# Patient Record
Sex: Male | Born: 1965
Health system: Southern US, Community
[De-identification: ages and names within clinical notes are randomized; demographics above are authoritative.]

## PROBLEM LIST (undated history)

## (undated) DIAGNOSIS — I10 Essential (primary) hypertension: Secondary | ICD-10-CM

## (undated) DIAGNOSIS — M542 Cervicalgia: Secondary | ICD-10-CM

## (undated) DIAGNOSIS — M549 Dorsalgia, unspecified: Secondary | ICD-10-CM

## (undated) HISTORY — DX: Cervicalgia: M54.2

## (undated) HISTORY — DX: Dorsalgia, unspecified: M54.9

## (undated) HISTORY — PX: NO PAST SURGERIES: SHX2092

---

## 1999-12-16 ENCOUNTER — Emergency Department (HOSPITAL_COMMUNITY): Admission: EM | Admit: 1999-12-16 | Discharge: 1999-12-16 | Payer: Self-pay | Admitting: Emergency Medicine

## 1999-12-16 ENCOUNTER — Encounter: Payer: Self-pay | Admitting: Emergency Medicine

## 2004-11-19 ENCOUNTER — Emergency Department (HOSPITAL_COMMUNITY): Admission: EM | Admit: 2004-11-19 | Discharge: 2004-11-20 | Payer: Self-pay | Admitting: *Deleted

## 2005-03-24 ENCOUNTER — Ambulatory Visit (HOSPITAL_BASED_OUTPATIENT_CLINIC_OR_DEPARTMENT_OTHER): Admission: RE | Admit: 2005-03-24 | Discharge: 2005-03-24 | Payer: Self-pay | Admitting: Obstetrics and Gynecology

## 2005-03-24 ENCOUNTER — Ambulatory Visit (HOSPITAL_COMMUNITY): Admission: RE | Admit: 2005-03-24 | Discharge: 2005-03-24 | Payer: Self-pay | Admitting: Obstetrics and Gynecology

## 2005-03-24 ENCOUNTER — Encounter (INDEPENDENT_AMBULATORY_CARE_PROVIDER_SITE_OTHER): Payer: Self-pay | Admitting: *Deleted

## 2008-06-28 ENCOUNTER — Encounter: Admission: RE | Admit: 2008-06-28 | Discharge: 2008-07-24 | Payer: Self-pay | Admitting: Family Medicine

## 2011-02-07 NOTE — Consult Note (Signed)
NAMEADARRYL, Taylor Lynch            ACCOUNT NO.:  [id number]   MEDICAL RECORD NO.:  [id number]          PATIENT TYPE:  EMS   LOCATION:  ED                           FACILITY:  [facility]   PHYSICIAN:  Fenton Malling. [name], Taylor.D.  DATE OF BIRTH:  [birth year]   DATE OF CONSULTATION:  11/20/2004  DATE OF DISCHARGE:                                   CONSULTATION   Taylor Lynch is a 45 year old black male originally from [place] whose  presented with a left perirectal abscess. He has no primary medical doctor,  came to [place] in the emergency room, Dr. Kyra Searles called me about him.   He denies any history of peptic ulcer disease, liver disease, colon disease  or change in bowel habits. This is his first perirectal abscess.   PAST MEDICAL HISTORY:  He has no allergies and is on no medications.  He has  no primary medical doctor.   REVIEW OF SYMPTOMS:  Pulmonary, cardiac, gastrointestinal, urologic is  negative.   He works in [employer] fund[employer].   PHYSICAL EXAMINATION:  VITAL SIGNS:  His temperature is 99.2, blood pressure  134/92, pulse 109, respirations 20.  GENERAL:  He is a well-nourished black male alert and cooperative on  physical exam.  LUNGS:  Clear to auscultation.  HEART:  Regular rate and rhythm without murmur or rub.  ABDOMEN:  Soft.   In a right lateral decubitus position, he has a fluctuant area of his left  buttocks kind of anterior on the rectum.   This is consistent with a perianal abscess.   DIAGNOSIS:  Perianal abscess.   PLAN:  Incision and drainage of this abscess in the emergency room and will  do this while he is there. I will send him home on Septra DS one tablet  twice a day for a week and give him some Vicodin for pain and tell him to do  sitz baths three times a day and see me back in a week.      DHN/MEDQ  D:  11/20/2004  T:  11/20/2004  Job:  [id number]

## 2011-02-07 NOTE — Op Note (Signed)
NAMELUCAH, DIAMANT            ACCOUNT NO.:  [id number]   MEDICAL RECORD NO.:  [id number]          PATIENT TYPE:  AMB   LOCATION:  [facility]                         FACILITY:  [facility]   PHYSICIAN:  Fenton Malling. [name], M.D.  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   OPERATION PERFORMED:  Rigid sigmoidoscopy to 20 cm and fistulectomy.   SURGEON:  Fenton Malling. [name], M.D.   ANESTHESIA:  General LMA with 20 mL of 0.25% Marcaine.   COMPLICATIONS:  None.   INDICATIONS FOR PROCEDURE:  Mr. Taylor Lynch is a 45 year old black male who I  drained a perianal abscess on in March of 2006.  He has now developed a  chronic draining sinus tract where this abscess was.  I think he has a  fistula which directly goes from his left anterior rectum into his anus.  The patient now comes for fistulectomy.  I discussed with him the  indications and potential complications, the potential complications  including but not limited to bleeding, infection and the possibility of  recurrent fistula.   DESCRIPTION OF PROCEDURE:  The patient completed a bowel prep at home, was  given a general LMA anesthesia and was placed in lithotomy position.  A  rigid sigmoidoscope was then passed to 20 cm.  I encountered a lot of food  particles at 20 cm, but below 20 cm his lower sigmoid colon and rectum  looked otherwise unremarkable.   I then prepped his perianal area with Betadine solution, sterilely draped  him.  The fistula tract was at the 2 o'clock position which was the left  anterior rectum.  I put methylene blue into this tract and then the tract  then surfaced at the junction of the dentate line almost at 12 o'clock.  I  then passed a probe down this tract and first flayed the tract open and then  excised the tract from  the skin at the perianal area down to the dentate  line.   I sent this to pathology.  I then controlled bleeding with Bovie  electrocautery, infiltrated the tissues with 20 mL of 0.25% Marcaine.  I  then placed a piece of Gelfoam covered with topical anesthetic ointment into  the rectal canal.   The wound was then sterilely dressed, the patient transported to recovery  room in good condition. He will be discharged home today to return to see me  in 10 to 14 days.  He knows to start sitz baths three times a day at home,  call for any interval problem.       DHN/MEDQ  D:  03/24/2005  T:  03/24/2005  Job:  [id number]

## 2011-05-22 ENCOUNTER — Ambulatory Visit: Payer: Self-pay

## 2011-05-22 ENCOUNTER — Other Ambulatory Visit: Payer: Self-pay | Admitting: Occupational Medicine

## 2011-05-22 DIAGNOSIS — R52 Pain, unspecified: Secondary | ICD-10-CM

## 2011-10-07 ENCOUNTER — Encounter (HOSPITAL_COMMUNITY): Payer: Self-pay | Admitting: *Deleted

## 2011-10-07 ENCOUNTER — Emergency Department (HOSPITAL_COMMUNITY)
Admission: EM | Admit: 2011-10-07 | Discharge: 2011-10-07 | Disposition: A | Discharge status: Home / Self Care | Payer: No Typology Code available for payment source | Attending: Emergency Medicine | Admitting: Emergency Medicine

## 2011-10-07 DIAGNOSIS — M549 Dorsalgia, unspecified: Secondary | ICD-10-CM

## 2011-10-07 DIAGNOSIS — M542 Cervicalgia: Secondary | ICD-10-CM

## 2011-10-07 DIAGNOSIS — Z79899 Other long term (current) drug therapy: Secondary | ICD-10-CM | POA: Insufficient documentation

## 2011-10-07 DIAGNOSIS — I10 Essential (primary) hypertension: Secondary | ICD-10-CM | POA: Insufficient documentation

## 2011-10-07 DIAGNOSIS — E119 Type 2 diabetes mellitus without complications: Secondary | ICD-10-CM | POA: Insufficient documentation

## 2011-10-07 HISTORY — DX: Essential (primary) hypertension: I10

## 2011-10-07 MED ORDER — OXYCODONE-ACETAMINOPHEN 5-325 MG PO TABS: 1.0000 | ORAL_TABLET | ORAL | Status: AC | PRN

## 2011-10-07 MED ORDER — IBUPROFEN 800 MG PO TABS
800.0000 mg | ORAL_TABLET | Freq: Once | ORAL | Status: AC
  Administered 2011-10-07: 800 mg via ORAL
  Filled 2011-10-07: qty 1

## 2011-10-07 MED ORDER — IBUPROFEN 600 MG PO TABS: 600.0000 mg | ORAL_TABLET | Freq: Four times a day (QID) | ORAL | Status: AC | PRN

## 2011-10-07 NOTE — ED Notes (Signed)
Discharge instructions reviewed with pt; verbalizes understanding.  No questions asked; no furthr c/o's voiced.  Ambulatory to lobby.
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
## 2015-09-19 ENCOUNTER — Emergency Department (HOSPITAL_COMMUNITY)
Admission: EM | Admit: 2015-09-19 | Discharge: 2015-09-19 | Discharge status: Absent without leave | Payer: Self-pay | Source: Home / Self Care

## 2015-10-05 ENCOUNTER — Ambulatory Visit: Payer: Self-pay | Admitting: Cardiology

## 2018-11-30 DIAGNOSIS — Z961 Presence of intraocular lens: Secondary | ICD-10-CM | POA: Diagnosis not present

## 2018-11-30 DIAGNOSIS — H401232 Low-tension glaucoma, bilateral, moderate stage: Secondary | ICD-10-CM | POA: Diagnosis not present

## 2018-11-30 DIAGNOSIS — E113593 Type 2 diabetes mellitus with proliferative diabetic retinopathy without macular edema, bilateral: Secondary | ICD-10-CM | POA: Diagnosis not present

## 2018-11-30 DIAGNOSIS — H26493 Other secondary cataract, bilateral: Secondary | ICD-10-CM | POA: Diagnosis not present

## 2019-06-02 DIAGNOSIS — E113593 Type 2 diabetes mellitus with proliferative diabetic retinopathy without macular edema, bilateral: Secondary | ICD-10-CM | POA: Diagnosis not present

## 2019-06-02 DIAGNOSIS — H401232 Low-tension glaucoma, bilateral, moderate stage: Secondary | ICD-10-CM | POA: Diagnosis not present

## 2019-06-02 DIAGNOSIS — H26493 Other secondary cataract, bilateral: Secondary | ICD-10-CM | POA: Diagnosis not present

## 2019-06-02 DIAGNOSIS — Z961 Presence of intraocular lens: Secondary | ICD-10-CM | POA: Diagnosis not present

## 2019-09-13 ENCOUNTER — Inpatient Hospital Stay (HOSPITAL_COMMUNITY)
Admission: EM | Admit: 2019-09-13 | Discharge: 2019-09-20 | DRG: 177 | Disposition: A | Discharge status: Home / Self Care | Payer: BC Managed Care – PPO | Attending: Internal Medicine | Admitting: Internal Medicine

## 2019-09-13 ENCOUNTER — Encounter (HOSPITAL_COMMUNITY): Payer: Self-pay | Admitting: Emergency Medicine

## 2019-09-13 ENCOUNTER — Emergency Department (HOSPITAL_COMMUNITY): Payer: BC Managed Care – PPO

## 2019-09-13 ENCOUNTER — Other Ambulatory Visit: Payer: Self-pay

## 2019-09-13 ENCOUNTER — Ambulatory Visit: Payer: BC Managed Care – PPO | Attending: Internal Medicine

## 2019-09-13 DIAGNOSIS — E1122 Type 2 diabetes mellitus with diabetic chronic kidney disease: Secondary | ICD-10-CM | POA: Diagnosis present

## 2019-09-13 DIAGNOSIS — R0602 Shortness of breath: Secondary | ICD-10-CM | POA: Diagnosis not present

## 2019-09-13 DIAGNOSIS — Z7984 Long term (current) use of oral hypoglycemic drugs: Secondary | ICD-10-CM | POA: Diagnosis not present

## 2019-09-13 DIAGNOSIS — I129 Hypertensive chronic kidney disease with stage 1 through stage 4 chronic kidney disease, or unspecified chronic kidney disease: Secondary | ICD-10-CM | POA: Diagnosis not present

## 2019-09-13 DIAGNOSIS — Z79899 Other long term (current) drug therapy: Secondary | ICD-10-CM

## 2019-09-13 DIAGNOSIS — E119 Type 2 diabetes mellitus without complications: Secondary | ICD-10-CM | POA: Diagnosis not present

## 2019-09-13 DIAGNOSIS — N179 Acute kidney failure, unspecified: Secondary | ICD-10-CM | POA: Diagnosis not present

## 2019-09-13 DIAGNOSIS — Z20822 Contact with and (suspected) exposure to covid-19: Secondary | ICD-10-CM

## 2019-09-13 DIAGNOSIS — N184 Chronic kidney disease, stage 4 (severe): Secondary | ICD-10-CM | POA: Diagnosis not present

## 2019-09-13 DIAGNOSIS — I10 Essential (primary) hypertension: Secondary | ICD-10-CM | POA: Diagnosis not present

## 2019-09-13 DIAGNOSIS — E1165 Type 2 diabetes mellitus with hyperglycemia: Secondary | ICD-10-CM | POA: Diagnosis present

## 2019-09-13 DIAGNOSIS — U071 COVID-19: Principal | ICD-10-CM | POA: Diagnosis present

## 2019-09-13 DIAGNOSIS — R079 Chest pain, unspecified: Secondary | ICD-10-CM | POA: Diagnosis not present

## 2019-09-13 DIAGNOSIS — J1282 Pneumonia due to coronavirus disease 2019: Secondary | ICD-10-CM | POA: Diagnosis present

## 2019-09-13 DIAGNOSIS — J9601 Acute respiratory failure with hypoxia: Secondary | ICD-10-CM | POA: Diagnosis present

## 2019-09-13 DIAGNOSIS — R0902 Hypoxemia: Secondary | ICD-10-CM | POA: Diagnosis not present

## 2019-09-13 DIAGNOSIS — Z20828 Contact with and (suspected) exposure to other viral communicable diseases: Secondary | ICD-10-CM | POA: Diagnosis not present

## 2019-09-13 DIAGNOSIS — J1289 Other viral pneumonia: Secondary | ICD-10-CM | POA: Diagnosis present

## 2019-09-13 DIAGNOSIS — R069 Unspecified abnormalities of breathing: Secondary | ICD-10-CM | POA: Diagnosis not present

## 2019-09-13 DIAGNOSIS — E860 Dehydration: Secondary | ICD-10-CM | POA: Diagnosis present

## 2019-09-13 DIAGNOSIS — Z209 Contact with and (suspected) exposure to unspecified communicable disease: Secondary | ICD-10-CM | POA: Diagnosis not present

## 2019-09-13 LAB — TROPONIN I (HIGH SENSITIVITY)
Troponin I (High Sensitivity): 33 ng/L — ABNORMAL HIGH (ref <18)
Troponin I (High Sensitivity): 31 ng/L — ABNORMAL HIGH (ref <18)

## 2019-09-13 LAB — BASIC METABOLIC PANEL
Anion gap: 12 (ref 5–15)
BUN: 19 mg/dL (ref 6–20)
CO2: 27 mmol/L (ref 22–32)
Calcium: 7.7 mg/dL — ABNORMAL LOW (ref 8.9–10.3)
Chloride: 96 mmol/L — ABNORMAL LOW (ref 98–111)
Creatinine, Ser: 1.86 mg/dL — ABNORMAL HIGH (ref 0.61–1.24)
GFR calc Af Amer: 46 mL/min — ABNORMAL LOW (ref >60)
GFR calc non Af Amer: 40 mL/min — ABNORMAL LOW (ref >60)
Glucose, Bld: 348 mg/dL — ABNORMAL HIGH (ref 70–99)
Potassium: 3.5 mmol/L (ref 3.5–5.1)
Sodium: 135 mmol/L (ref 135–145)

## 2019-09-13 LAB — POC SARS CORONAVIRUS 2 AG -  ED: SARS Coronavirus 2 Ag: NEGATIVE

## 2019-09-13 LAB — HEPATIC FUNCTION PANEL
ALT: 36 U/L (ref 0–44)
AST: 49 U/L — ABNORMAL HIGH (ref 15–41)
Albumin: 2.6 g/dL — ABNORMAL LOW (ref 3.5–5.0)
Alkaline Phosphatase: 52 U/L (ref 38–126)
Bilirubin, Direct: 0.2 mg/dL (ref 0.0–0.2)
Indirect Bilirubin: 0.6 mg/dL (ref 0.3–0.9)
Total Bilirubin: 0.8 mg/dL (ref 0.3–1.2)
Total Protein: 7.1 g/dL (ref 6.5–8.1)

## 2019-09-13 LAB — CBC
HCT: 41.5 % (ref 39.0–52.0)
Hemoglobin: 14 g/dL (ref 13.0–17.0)
MCH: 27.7 pg (ref 26.0–34.0)
MCHC: 33.7 g/dL (ref 30.0–36.0)
MCV: 82.2 fL (ref 80.0–100.0)
Platelets: 203 10*3/uL (ref 150–400)
RBC: 5.05 MIL/uL (ref 4.22–5.81)
RDW: 14.6 % (ref 11.5–15.5)
WBC: 8.2 10*3/uL (ref 4.0–10.5)
nRBC: 0 % (ref 0.0–0.2)

## 2019-09-13 LAB — D-DIMER, QUANTITATIVE: D-Dimer, Quant: 0.86 ug/mL-FEU — ABNORMAL HIGH (ref 0.00–0.50)

## 2019-09-13 LAB — LIPASE, BLOOD: Lipase: 30 U/L (ref 11–51)

## 2019-09-13 MED ORDER — IOHEXOL 350 MG/ML SOLN
75.0000 mL | Freq: Once | INTRAVENOUS | Status: AC | PRN
  Administered 2019-09-13: 75 mL via INTRAVENOUS

## 2019-09-13 MED ORDER — ACETAMINOPHEN 500 MG PO TABS
1000.0000 mg | ORAL_TABLET | Freq: Once | ORAL | Status: AC
  Administered 2019-09-13: 1000 mg via ORAL
  Filled 2019-09-13: qty 2

## 2019-09-13 NOTE — ED Triage Notes (Signed)
Pt here via EMS, one of his coworkers has covid. Pt since Saturday has felt unwell, had nonproductive cough, and shob. 84% on room air, improvement to 97% on 4L O2.
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
## 2019-09-13 NOTE — ED Notes (Signed)
Ambulated pt in room, pt saturations dropped to 74% on ra, increased WOB, EDP made aware. Pt placed back on nasal cannula. Pt wife updated on status.

## 2019-09-13 NOTE — ED Notes (Signed)
Please call wife for updates : [phone]

## 2019-09-14 ENCOUNTER — Encounter (HOSPITAL_COMMUNITY): Payer: Self-pay | Admitting: Emergency Medicine

## 2019-09-14 DIAGNOSIS — J1289 Other viral pneumonia: Secondary | ICD-10-CM | POA: Diagnosis present

## 2019-09-14 DIAGNOSIS — N184 Chronic kidney disease, stage 4 (severe): Secondary | ICD-10-CM | POA: Diagnosis present

## 2019-09-14 DIAGNOSIS — U071 COVID-19: Principal | ICD-10-CM

## 2019-09-14 DIAGNOSIS — I129 Hypertensive chronic kidney disease with stage 1 through stage 4 chronic kidney disease, or unspecified chronic kidney disease: Secondary | ICD-10-CM | POA: Diagnosis present

## 2019-09-14 DIAGNOSIS — I10 Essential (primary) hypertension: Secondary | ICD-10-CM

## 2019-09-14 DIAGNOSIS — N179 Acute kidney failure, unspecified: Secondary | ICD-10-CM | POA: Diagnosis present

## 2019-09-14 DIAGNOSIS — E1165 Type 2 diabetes mellitus with hyperglycemia: Secondary | ICD-10-CM | POA: Diagnosis present

## 2019-09-14 DIAGNOSIS — Z79899 Other long term (current) drug therapy: Secondary | ICD-10-CM | POA: Diagnosis not present

## 2019-09-14 DIAGNOSIS — E860 Dehydration: Secondary | ICD-10-CM | POA: Diagnosis present

## 2019-09-14 DIAGNOSIS — E119 Type 2 diabetes mellitus without complications: Secondary | ICD-10-CM | POA: Diagnosis not present

## 2019-09-14 DIAGNOSIS — R0602 Shortness of breath: Secondary | ICD-10-CM | POA: Diagnosis present

## 2019-09-14 DIAGNOSIS — J9601 Acute respiratory failure with hypoxia: Secondary | ICD-10-CM

## 2019-09-14 DIAGNOSIS — E1122 Type 2 diabetes mellitus with diabetic chronic kidney disease: Secondary | ICD-10-CM | POA: Diagnosis present

## 2019-09-14 DIAGNOSIS — Z7984 Long term (current) use of oral hypoglycemic drugs: Secondary | ICD-10-CM | POA: Diagnosis not present

## 2019-09-14 DIAGNOSIS — R079 Chest pain, unspecified: Secondary | ICD-10-CM | POA: Diagnosis not present

## 2019-09-14 LAB — RESPIRATORY PANEL BY PCR

## 2019-09-14 LAB — CBC
HCT: 40.5 % (ref 39.0–52.0)
Hemoglobin: 13.6 g/dL (ref 13.0–17.0)
MCH: 28 pg (ref 26.0–34.0)
MCHC: 33.6 g/dL (ref 30.0–36.0)
MCV: 83.3 fL (ref 80.0–100.0)
Platelets: 215 10*3/uL (ref 150–400)
RBC: 4.86 MIL/uL (ref 4.22–5.81)
RDW: 14.6 % (ref 11.5–15.5)
WBC: 9.8 10*3/uL (ref 4.0–10.5)
nRBC: 0 % (ref 0.0–0.2)

## 2019-09-14 LAB — BASIC METABOLIC PANEL
Anion gap: 12 (ref 5–15)
BUN: 23 mg/dL — ABNORMAL HIGH (ref 6–20)
CO2: 30 mmol/L (ref 22–32)
Calcium: 7.8 mg/dL — ABNORMAL LOW (ref 8.9–10.3)
Chloride: 97 mmol/L — ABNORMAL LOW (ref 98–111)
Creatinine, Ser: 1.9 mg/dL — ABNORMAL HIGH (ref 0.61–1.24)
GFR calc Af Amer: 45 mL/min — ABNORMAL LOW (ref >60)
GFR calc non Af Amer: 39 mL/min — ABNORMAL LOW (ref >60)
Glucose, Bld: 297 mg/dL — ABNORMAL HIGH (ref 70–99)
Potassium: 3.7 mmol/L (ref 3.5–5.1)
Sodium: 139 mmol/L (ref 135–145)

## 2019-09-14 LAB — URINALYSIS, ROUTINE W REFLEX MICROSCOPIC
Bacteria, UA: NONE SEEN
Bilirubin Urine: NEGATIVE
Glucose, UA: >=500 mg/dL — AB
Ketones, ur: NEGATIVE mg/dL
Leukocytes,Ua: NEGATIVE
Nitrite: NEGATIVE
Protein, ur: >=300 mg/dL — AB
Specific Gravity, Urine: 1.017 (ref 1.005–1.030)
pH: 5 (ref 5.0–8.0)

## 2019-09-14 LAB — HIV ANTIBODY (ROUTINE TESTING W REFLEX): HIV Screen 4th Generation wRfx: NONREACTIVE

## 2019-09-14 LAB — SAMPLE TO BLOOD BANK

## 2019-09-14 LAB — CREATININE, URINE, RANDOM: Creatinine, Urine: 90.55 mg/dL

## 2019-09-14 LAB — OSMOLALITY: Osmolality: 313 mOsm/kg — ABNORMAL HIGH (ref 275–295)

## 2019-09-14 LAB — GLUCOSE, CAPILLARY
Glucose-Capillary: 403 mg/dL — ABNORMAL HIGH (ref 70–99)
Glucose-Capillary: 366 mg/dL — ABNORMAL HIGH (ref 70–99)

## 2019-09-14 LAB — SARS CORONAVIRUS 2 (TAT 6-24 HRS): SARS Coronavirus 2: POSITIVE — AB

## 2019-09-14 LAB — OSMOLALITY, URINE: Osmolality, Ur: 410 mosm/kg (ref 300–900)

## 2019-09-14 LAB — HEMOGLOBIN A1C
Hgb A1c MFr Bld: 9.2 % — ABNORMAL HIGH (ref 4.8–5.6)
Mean Plasma Glucose: 217.34 mg/dL

## 2019-09-14 LAB — C-REACTIVE PROTEIN: CRP: 20.7 mg/dL — ABNORMAL HIGH (ref <1.0)

## 2019-09-14 LAB — PROCALCITONIN: Procalcitonin: 0.16 ng/mL

## 2019-09-14 LAB — SODIUM, URINE, RANDOM: Sodium, Ur: 16 mmol/L

## 2019-09-14 LAB — D-DIMER, QUANTITATIVE: D-Dimer, Quant: 0.82 ug/mL-FEU — ABNORMAL HIGH (ref 0.00–0.50)

## 2019-09-14 LAB — CBG MONITORING, ED: Glucose-Capillary: 333 mg/dL — ABNORMAL HIGH (ref 70–99)

## 2019-09-14 LAB — BRAIN NATRIURETIC PEPTIDE: B Natriuretic Peptide: 35.8 pg/mL (ref 0.0–100.0)

## 2019-09-14 MED ORDER — GLIPIZIDE ER 10 MG PO TB24: 10.0000 mg | ORAL_TABLET | Freq: Every day | ORAL | Status: DC

## 2019-09-14 MED ORDER — INSULIN ASPART 100 UNIT/ML ~~LOC~~ SOLN
30.0000 [IU] | Freq: Once | SUBCUTANEOUS | Status: AC
  Administered 2019-09-14: 30 [IU] via SUBCUTANEOUS

## 2019-09-14 MED ORDER — ACETAMINOPHEN 325 MG PO TABS: 650.0000 mg | ORAL_TABLET | Freq: Four times a day (QID) | ORAL | Status: DC | PRN

## 2019-09-14 MED ORDER — INSULIN ASPART 100 UNIT/ML ~~LOC~~ SOLN
0.0000 [IU] | Freq: Three times a day (TID) | SUBCUTANEOUS | Status: DC
  Administered 2019-09-14: 15 [IU] via SUBCUTANEOUS
  Administered 2019-09-15: 11 [IU] via SUBCUTANEOUS
  Administered 2019-09-15 – 2019-09-16 (×3): 5 [IU] via SUBCUTANEOUS
  Administered 2019-09-17: 3 [IU] via SUBCUTANEOUS
  Administered 2019-09-17: 15 [IU] via SUBCUTANEOUS
  Administered 2019-09-17: 8 [IU] via SUBCUTANEOUS
  Administered 2019-09-18: 5 [IU] via SUBCUTANEOUS
  Administered 2019-09-18: 2 [IU] via SUBCUTANEOUS
  Administered 2019-09-18 – 2019-09-19 (×2): 11 [IU] via SUBCUTANEOUS
  Administered 2019-09-19: 15 [IU] via SUBCUTANEOUS
  Administered 2019-09-20: 3 [IU] via SUBCUTANEOUS
  Administered 2019-09-20: 5 [IU] via SUBCUTANEOUS
  Administered 2019-09-20: 15 [IU] via SUBCUTANEOUS

## 2019-09-14 MED ORDER — DEXAMETHASONE SODIUM PHOSPHATE 10 MG/ML IJ SOLN
6.0000 mg | INTRAMUSCULAR | Status: DC
  Administered 2019-09-14: 6 mg via INTRAVENOUS
  Filled 2019-09-14: qty 1

## 2019-09-14 MED ORDER — CARVEDILOL 3.125 MG PO TABS
6.2500 mg | ORAL_TABLET | Freq: Two times a day (BID) | ORAL | Status: DC
  Administered 2019-09-14 – 2019-09-20 (×13): 6.25 mg via ORAL
  Filled 2019-09-14 (×13): qty 2

## 2019-09-14 MED ORDER — INSULIN GLARGINE 100 UNIT/ML ~~LOC~~ SOLN
40.0000 [IU] | Freq: Every day | SUBCUTANEOUS | Status: DC
  Administered 2019-09-14 – 2019-09-15 (×2): 40 [IU] via SUBCUTANEOUS
  Filled 2019-09-14 (×3): qty 0.4

## 2019-09-14 MED ORDER — TIMOLOL MALEATE 0.5 % OP SOLN
1.0000 [drp] | Freq: Two times a day (BID) | OPHTHALMIC | Status: DC
  Administered 2019-09-14 – 2019-09-20 (×11): 1 [drp] via OPHTHALMIC
  Filled 2019-09-14: qty 5

## 2019-09-14 MED ORDER — TOCILIZUMAB 400 MG/20ML IV SOLN
800.0000 mg | Freq: Once | INTRAVENOUS | Status: AC
  Administered 2019-09-14: 800 mg via INTRAVENOUS
  Filled 2019-09-14: qty 40

## 2019-09-14 MED ORDER — LACTATED RINGERS IV SOLN: INTRAVENOUS | Status: DC

## 2019-09-14 MED ORDER — METHYLPREDNISOLONE SODIUM SUCC 125 MG IJ SOLR
60.0000 mg | Freq: Two times a day (BID) | INTRAMUSCULAR | Status: DC
  Administered 2019-09-14 – 2019-09-15 (×2): 60 mg via INTRAVENOUS
  Filled 2019-09-14 (×2): qty 2

## 2019-09-14 MED ORDER — SODIUM CHLORIDE 0.9 % IV SOLN
200.0000 mg | Freq: Once | INTRAVENOUS | Status: AC
  Administered 2019-09-14: 200 mg via INTRAVENOUS
  Filled 2019-09-14: qty 40
  Filled 2019-09-14: qty 200

## 2019-09-14 MED ORDER — DM-GUAIFENESIN ER 30-600 MG PO TB12
1.0000 | ORAL_TABLET | Freq: Two times a day (BID) | ORAL | Status: DC
  Administered 2019-09-14 – 2019-09-20 (×14): 1 via ORAL
  Filled 2019-09-14 (×14): qty 1

## 2019-09-14 MED ORDER — SODIUM CHLORIDE 0.9 % IV BOLUS
500.0000 mL | Freq: Once | INTRAVENOUS | Status: AC
  Administered 2019-09-14: 500 mL via INTRAVENOUS

## 2019-09-14 MED ORDER — LOPERAMIDE HCL 2 MG PO CAPS
2.0000 mg | ORAL_CAPSULE | Freq: Four times a day (QID) | ORAL | Status: DC | PRN
  Administered 2019-09-16: 2 mg via ORAL
  Filled 2019-09-14: qty 1

## 2019-09-14 MED ORDER — AMLODIPINE BESYLATE 10 MG PO TABS
10.0000 mg | ORAL_TABLET | Freq: Every day | ORAL | Status: DC
  Administered 2019-09-14 – 2019-09-20 (×7): 10 mg via ORAL
  Filled 2019-09-14 (×7): qty 1

## 2019-09-14 MED ORDER — SIMVASTATIN 20 MG PO TABS
20.0000 mg | ORAL_TABLET | Freq: Every evening | ORAL | Status: DC
  Administered 2019-09-14 – 2019-09-20 (×7): 20 mg via ORAL
  Filled 2019-09-14 (×7): qty 1

## 2019-09-14 MED ORDER — ONDANSETRON HCL 4 MG/2ML IJ SOLN: 4.0000 mg | Freq: Four times a day (QID) | INTRAMUSCULAR | Status: DC | PRN

## 2019-09-14 MED ORDER — INSULIN ASPART 100 UNIT/ML ~~LOC~~ SOLN
0.0000 [IU] | Freq: Every day | SUBCUTANEOUS | Status: DC
  Administered 2019-09-14: 3 [IU] via SUBCUTANEOUS
  Administered 2019-09-16 – 2019-09-17 (×2): 4 [IU] via SUBCUTANEOUS
  Administered 2019-09-18: 5 [IU] via SUBCUTANEOUS

## 2019-09-14 MED ORDER — INSULIN GLARGINE 100 UNIT/ML ~~LOC~~ SOLN
25.0000 [IU] | Freq: Every day | SUBCUTANEOUS | Status: DC
  Filled 2019-09-14: qty 0.25

## 2019-09-14 MED ORDER — SODIUM CHLORIDE 0.9 % IV SOLN
100.0000 mg | Freq: Every day | INTRAVENOUS | Status: AC
  Administered 2019-09-15 – 2019-09-18 (×4): 100 mg via INTRAVENOUS
  Filled 2019-09-14 (×6): qty 20

## 2019-09-14 MED ORDER — ENOXAPARIN SODIUM 40 MG/0.4ML ~~LOC~~ SOLN
40.0000 mg | Freq: Every day | SUBCUTANEOUS | Status: DC
  Administered 2019-09-14 – 2019-09-19 (×6): 40 mg via SUBCUTANEOUS
  Filled 2019-09-14 (×6): qty 0.4

## 2019-09-14 MED ORDER — INSULIN ASPART 100 UNIT/ML ~~LOC~~ SOLN
3.0000 [IU] | Freq: Three times a day (TID) | SUBCUTANEOUS | Status: DC
  Administered 2019-09-14 – 2019-09-15 (×3): 3 [IU] via SUBCUTANEOUS

## 2019-09-14 MED ORDER — INSULIN ASPART 100 UNIT/ML ~~LOC~~ SOLN
0.0000 [IU] | Freq: Three times a day (TID) | SUBCUTANEOUS | Status: DC
  Administered 2019-09-14: 11 [IU] via SUBCUTANEOUS

## 2019-09-14 NOTE — ED Notes (Signed)
CBG 333. °

## 2019-09-14 NOTE — ED Notes (Signed)
Pt sitting up on side of bed 
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
## 2019-09-14 NOTE — Progress Notes (Signed)
PROGRESS NOTE                                                                                                                                                                                                             Patient Demographics:    Taylor Lynch, is a 53 y.o. male, DOB - 24-Jan-1966, EYE:[id number]  Outpatient Primary MD for the patient is Patient, No Pcp Per    LOS - 0  Admit date - 09/13/2019    Chief complaint.  Shortness of breath     Brief Narrative  53 y.o.malewith medical history significant ofhypertension and type II diabetes who presented with increasing shortness of breath, further work-up suggested acute hypoxic respiratory failure due to COVID-19 pneumonia, he initially required 4 L nasal cannula oxygen was started on IV steroids and remdesivir but continued to get more hypoxic was eventually sent to [facility] on 7 L nasal cannula oxygen.   Subjective:    Taylor Lynch today has, No headache, No chest pain, No abdominal pain - No Nausea, No new weakness tingling or numbness, +ve SOB   Assessment  & Plan :     1. Acute Hypoxic Resp. Failure due to Acute Covid 19 Viral Pneumonitis during the ongoing 2020 Covid 19 Pandemic - he has severe disease, currently requiring 7 L of oxygen, unable to talk in full sentences, working hard for breathing, CRP is greater than 20, CTA rules out PE, already on IV steroids and remdesivir.  Consent obtained for Actemra and will be provided ASAP.  Will monitor closely.  Encouraged the patient to sit up in chair in the daytime use I-S and flutter valve for pulmonary toiletry and then prone in bed when at night.  Actemra off label use - patient was told that if COVID-19 pneumonitis gets worse we might potentially use Actemra off label, patient denies any known history of tuberculosis or hepatitis, understands the risks and benefits and wants to proceed with Actemra treatment  if required.   SpO2: 90 % O2 Flow Rate (L/min): 7 L/min  Recent Labs  Lab 09/13/19 1904 09/13/19 1940 09/14/19 1122  CRP  --   --  20.7*  DDIMER  --  0.86*  --   SARSCOV2NAA POSITIVE*  --   --     Hepatic Function Latest Ref Rng & Units 09/13/2019  Total  Protein 6.5 - 8.1 g/dL 7.1  Albumin 3.5 - 5.0 g/dL 2.6(L)  AST 15 - 41 U/L 49(H)  ALT 0 - 44 U/L 36  Alk Phosphatase 38 - 126 U/L 52  Total Bilirubin 0.3 - 1.2 mg/dL 0.8  Bilirubin, Direct 0.0 - 0.2 mg/dL 0.2    2.  Essential hypertension.  Placed on Norvasc and hydralazine.  3.  COVID-19 gastroenteritis causing dehydration.  Gentle IV fluids for hydration.  4.  AKI.  Due to #3 above, as needed Imodium, IV fluids and monitor.  5.  DM type II.  Due to AKI hold oral hypoglycemic, Lantus, premeal NovoLog and sliding scale.  Monitor and adjust.  Outpatient glycemic control poor due to hyperglycemia.  Lab Results  Component Value Date   HGBA1C 9.2 (H) 09/14/2019   CBG (last 3)  Recent Labs    09/14/19 0731  GLUCAP 333*        Condition - Extremely Guarded  Family Communication  :  None  Code Status :  Full  Diet :   Diet Order            Diet Heart Room service appropriate? Yes; Fluid consistency: Thin  Diet effective now               Disposition Plan  :  TBD  Consults  :  None  Procedures  :     CTA - 1. No CT evidence of pulmonary embolism. 2. Multifocal pneumonia. Clinical correlation and follow-up to resolution recommended.  PUD Prophylaxis :    DVT Prophylaxis  :  Lovenox   Lab Results  Component Value Date   PLT 215 09/14/2019    Inpatient Medications  Scheduled Meds: . amLODipine  10 mg Oral Daily  . carvedilol  6.25 mg Oral BID WC  . dextromethorphan-guaiFENesin  1 tablet Oral BID  . enoxaparin (LOVENOX) injection  40 mg Subcutaneous Daily  . insulin aspart  0-15 Units Subcutaneous TID WC  . insulin aspart  0-5 Units Subcutaneous QHS  . insulin aspart  3 Units Subcutaneous  TID WC  . insulin glargine  25 Units Subcutaneous Daily  . methylPREDNISolone (SOLU-MEDROL) injection  60 mg Intravenous Q12H  . simvastatin  20 mg Oral QPM  . timolol  1 drop Both Eyes BID   Continuous Infusions: . lactated ringers    . [START ON 09/15/2019] remdesivir 100 mg in NS 100 mL    . tocilizumab (ACTEMRA) IV     PRN Meds:.acetaminophen, loperamide, ondansetron (ZOFRAN) IV  Antibiotics  :    Anti-infectives (From admission, onward)   Start     Dose/Rate Route Frequency Ordered Stop   09/15/19 1000  remdesivir 100 mg in sodium chloride 0.9 % 100 mL IVPB     100 mg 200 mL/hr over 30 Minutes Intravenous Daily 09/14/19 0150 09/19/19 0959   09/14/19 0230  remdesivir 200 mg in sodium chloride 0.9% 250 mL IVPB     200 mg 580 mL/hr over 30 Minutes Intravenous Once 09/14/19 0150 09/14/19 0456       Time Spent in minutes  30   Lala Lund M.D on 09/14/2019 at 2:23 PM  To page go to [email or web address] - password [facility]  [facility] -  Office  [phone]    See all Orders from today for further details    Objective:   Vitals:   09/14/19 0945 09/14/19 1000 09/14/19 1100 09/14/19 1200  BP:  (!) 138/97 133/81 (!) 143/81  Pulse: 91 93  Resp: (!) 26 (!) 28 (!) 31 (!) 31  Temp:      TempSrc:      SpO2: 100% 98%  90%    Wt Readings from Last 3 Encounters:  No data found for Wt     Intake/Output Summary (Last 24 hours) at 09/14/2019 1423 Last data filed at 09/14/2019 0740 Gross per 24 hour  Intake --  Output 520 ml  Net -520 ml     Physical Exam  Awake Alert, Oriented X 3, No new F.N deficits, Normal affect [place].AT,PERRAL Supple Neck,No JVD, No cervical lymphadenopathy appriciated.  Symmetrical Chest wall movement, Good air movement bilaterally, CTAB RRR,No Gallops,Rubs or new Murmurs, No Parasternal Heave +ve B.Sounds, Abd Soft, No tenderness, No organomegaly appriciated, No rebound - guarding or rigidity. No Cyanosis, Clubbing or edema, No new  Rash or bruise      Data Review:    CBC Recent Labs  Lab 09/13/19 1804 09/14/19 0408  WBC 8.2 9.8  HGB 14.0 13.6  HCT 41.5 40.5  PLT 203 215  MCV 82.2 83.3  MCH 27.7 28.0  MCHC 33.7 33.6  RDW 14.6 14.6    Chemistries  Recent Labs  Lab 09/13/19 1804 09/13/19 1850 09/14/19 0408  NA 135  --  139  K 3.5  --  3.7  CL 96*  --  97*  CO2 27  --  30  GLUCOSE 348*  --  297*  BUN 19  --  23*  CREATININE 1.86*  --  1.90*  CALCIUM 7.7*  --  7.8*  AST  --  49*  --   ALT  --  36  --   ALKPHOS  --  52  --   BILITOT  --  0.8  --    ------------------------------------------------------------------------------------------------------------------ No results for input(s): CHOL, HDL, LDLCALC, TRIG, CHOLHDL, LDLDIRECT in the last 72 hours.  Lab Results  Component Value Date   HGBA1C 9.2 (H) 09/14/2019   ------------------------------------------------------------------------------------------------------------------ No results for input(s): TSH, T4TOTAL, T3FREE, THYROIDAB in the last 72 hours.  Invalid input(s): FREET3  Cardiac Enzymes No results for input(s): CKMB, TROPONINI, MYOGLOBIN in the last 168 hours.  Invalid input(s): CK ------------------------------------------------------------------------------------------------------------------ No results found for: BNP  Micro Results Recent Results (from the past 240 hour(s))  SARS CORONAVIRUS 2 (TAT 6-24 HRS) Nasopharyngeal Nasopharyngeal Swab     Status: Abnormal   Collection Time: 09/13/19  7:04 PM   Specimen: Nasopharyngeal Swab  Result Value Ref Range Status   SARS Coronavirus 2 POSITIVE (A) NEGATIVE Final    Comment: RESULT CALLED TO, READ BACK BY AND VERIFIED WITH: Neldon Labella 0131 09/14/2019 D BRADLEY (NOTE) SARS-CoV-2 target nucleic acids are DETECTED. The SARS-CoV-2 RNA is generally detectable in upper and lower respiratory specimens during the acute phase of infection. Positive results are indicative of  the presence of SARS-CoV-2 RNA. Clinical correlation with patient history and other diagnostic information is  necessary to determine patient infection status. Positive results do not rule out bacterial infection or co-infection with other viruses.  The expected result is Negative. Fact Sheet for Patients: SugarRoll.be Fact Sheet for Healthcare Providers: https://www.woods-mathews.com/ This test is not yet approved or cleared by the [place] FDA and  has been authorized for detection and/or diagnosis of SARS-CoV-2 by FDA under an Emergency Use Authorization (EUA). This EUA will remain  in effect (meaning this test can be used) for t he duration of the COVID-19 declaration under Section 564(b)(1) of the Act, 21 U.S.C. section 360bbb-3(b)(1), unless the  authorization is terminated or revoked sooner. Performed at [facility], [place] [address], [place], [place] [address]   Respiratory Panel by PCR     Status: None   Collection Time: 09/14/19  4:08 AM   Specimen: Nasopharyngeal Swab; Respiratory  Result Value Ref Range Status   Adenovirus NOT DETECTED NOT DETECTED Final   Coronavirus 229E NOT DETECTED NOT DETECTED Final    Comment: (NOTE) The Coronavirus on the Respiratory Panel, DOES NOT test for the novel  Coronavirus (2019 nCoV)    Coronavirus HKU1 NOT DETECTED NOT DETECTED Final   Coronavirus NL63 NOT DETECTED NOT DETECTED Final   Coronavirus OC43 NOT DETECTED NOT DETECTED Final   Metapneumovirus NOT DETECTED NOT DETECTED Final   Rhinovirus / Enterovirus NOT DETECTED NOT DETECTED Final   Influenza A NOT DETECTED NOT DETECTED Final   Influenza B NOT DETECTED NOT DETECTED Final   Parainfluenza Virus 1 NOT DETECTED NOT DETECTED Final   Parainfluenza Virus 2 NOT DETECTED NOT DETECTED Final   Parainfluenza Virus 3 NOT DETECTED NOT DETECTED Final   Parainfluenza Virus 4 NOT DETECTED NOT DETECTED Final   Respiratory Syncytial Virus NOT  DETECTED NOT DETECTED Final   Bordetella pertussis NOT DETECTED NOT DETECTED Final   Chlamydophila pneumoniae NOT DETECTED NOT DETECTED Final   Mycoplasma pneumoniae NOT DETECTED NOT DETECTED Final    Comment: Performed at [facility], [address] [place], [place] [address]    Radiology Reports CT Angio Chest PE W and/or Wo Contrast  Result Date: 09/13/2019 CLINICAL DATA:  53 year old male with chest pain. Concern for pulmonary embolism. EXAM: CT ANGIOGRAPHY CHEST WITH CONTRAST TECHNIQUE: Multidetector CT imaging of the chest was performed using the standard protocol during bolus administration of intravenous contrast. Multiplanar CT image reconstructions and MIPs were obtained to evaluate the vascular anatomy. CONTRAST:  73m OMNIPAQUE IOHEXOL 350 MG/ML SOLN COMPARISON:  Chest radiograph dated 09/13/2019. FINDINGS: Cardiovascular: Top-normal cardiac size. No pericardial effusion. The thoracic aorta is unremarkable. No pulmonary artery embolism identified. Mediastinum/Nodes: No hilar or mediastinal adenopathy. The esophagus and the thyroid gland are grossly unremarkable. No mediastinal fluid collection or hematoma. Lungs/Pleura: Bilateral patchy airspace opacities most consistent with multifocal pneumonia, likely atypical or viral etiology. Clinical correlation and follow-up to resolution recommended. There is no pleural effusion or pneumothorax. The central airways are patent. Upper Abdomen: No acute abnormality. Musculoskeletal: No chest wall abnormality. No acute or significant osseous findings. Review of the MIP images confirms the above findings. IMPRESSION: 1. No CT evidence of pulmonary embolism. 2. Multifocal pneumonia. Clinical correlation and follow-up to resolution recommended. Electronically Signed   By: AAnner CreteM.D.   On: 09/13/2019 22:51   DG Chest Portable 1 View  Result Date: 09/13/2019 CLINICAL DATA:  Shortness of breath, diabetes mellitus, hypertension EXAM:  PORTABLE CHEST 1 VIEW COMPARISON:  Portable exam 1818 hours without priors for comparison FINDINGS: Upper normal heart size. Normal mediastinal contours and pulmonary vascularity. Patchy infiltrates at the inferior lungs bilaterally greater on LEFT question multifocal pneumonia, less likely atelectasis. Upper lungs clear. No pleural effusion or pneumothorax. IMPRESSION: Bibasilar opacities RIGHT greater than LEFT favor multifocal pneumonia over atelectasis. Electronically Signed   By: MLavonia DanaM.D.   On: 09/13/2019 19:09

## 2019-09-14 NOTE — ED Notes (Signed)
Pt laying on abdomen.

## 2019-09-14 NOTE — Progress Notes (Signed)
Agree with Taylor Lynch recent H&P from earlier today:  Taylor Lynch is a 53 y.o. male with medical history significant of hypertension and type II diabetes who presented with increasing shortness of breath. Patient reports that starting last week he started to feel ill and had nausea, dry heaving and some diarrhea.  Has had decreased p.o. intake and has not really eaten since last week due to loss of taste.  Started to feel more shortness of breath today and increased cough.  Reports that a coworker is Covid positive.  ED Course: He was febrile up to 102 and was 84% on room air with improvement to 97% on 4 L via nasal cannula.  He was intermittently hypertensive up to 145/89 and had heart rate in the 90s. CBC showed no leukocytosis or anemia.  Glucose of 348.  Creatinine of 1.86.  Troponin of 33 and then 31.  D-dimer at 0.86.  CT chest obtained did not show any pulmonary embolism but had multifocal pneumonia. POC negative but PCR COVID positive.   Examined at bedside, no acute distress and resting in prone position. Requiring 6 L [place] and tolerating. Denies any complaints.  General: Awake and alert, no acute distress in prone position Lungs: on O2, cough, poor auscultation Heart: NSR on telemetry, no disposable steth in rooom  A/P Acute hypoxic respiratory failure in the setting of Multifocal pneumonia due to COVID CT chest negative for PE IV Decadron daily Remdesivir -to be transferred to [facility]  Acute kidney injury Received 500 cc bolus without much improvement, unknown baseline Cr, currently 1.9 Avoid nephrotoxic agent  Hypertension - Hold Lisinopril-HCTZ  Type 2 diabetes - Moderate SSI  Marva Panda, DO 12:15 PM

## 2019-09-14 NOTE — ED Notes (Addendum)
Dr Segal at bedside.  

## 2019-09-14 NOTE — ED Notes (Signed)
Pt given breakfast tray. Pt sitting on side of bed to eat, 6 L [place] still in place. Pt states that he is feeling a little better than this morning. Pt spoke with family on phone. Pt given pitcher of water. Urinal and call bell at bedside. Pt states that he plans lay on his stomach when he is done with his breakfast.

## 2019-09-14 NOTE — ED Notes (Addendum)
Pt prone on bed, on 6 l [place], pt has dry cough. Pt with call bell and phone, empty urinal at bedside. Lung sounds diminished throughout.  Consent for transfer to [facility] obtained.

## 2019-09-14 NOTE — ED Notes (Signed)
Pt still laying on stomach, pt states that he is feeling ok. Given some water. Oxygen, 6 L [place] still in place. In no acute distress at this time. Will continue to monitor.

## 2019-09-15 ENCOUNTER — Encounter (HOSPITAL_COMMUNITY): Payer: Self-pay | Admitting: Emergency Medicine

## 2019-09-15 LAB — CBC WITH DIFFERENTIAL/PLATELET
Abs Immature Granulocytes: 0.04 10*3/uL (ref 0.00–0.07)
Basophils Absolute: 0 10*3/uL (ref 0.0–0.1)
Basophils Relative: 0 %
Eosinophils Absolute: 0 10*3/uL (ref 0.0–0.5)
Eosinophils Relative: 0 %
HCT: 39.1 % (ref 39.0–52.0)
Hemoglobin: 12.9 g/dL — ABNORMAL LOW (ref 13.0–17.0)
Immature Granulocytes: 1 %
Lymphocytes Relative: 6 %
Lymphs Abs: 0.5 10*3/uL — ABNORMAL LOW (ref 0.7–4.0)
MCH: 27 pg (ref 26.0–34.0)
MCHC: 33 g/dL (ref 30.0–36.0)
MCV: 81.8 fL (ref 80.0–100.0)
Monocytes Absolute: 0.5 10*3/uL (ref 0.1–1.0)
Monocytes Relative: 6 %
Neutro Abs: 6.7 10*3/uL (ref 1.7–7.7)
Neutrophils Relative %: 87 %
Platelets: 247 10*3/uL (ref 150–400)
RBC: 4.78 MIL/uL (ref 4.22–5.81)
RDW: 14.5 % (ref 11.5–15.5)
WBC: 7.7 10*3/uL (ref 4.0–10.5)
nRBC: 0 % (ref 0.0–0.2)

## 2019-09-15 LAB — COMPREHENSIVE METABOLIC PANEL
ALT: 33 U/L (ref 0–44)
AST: 34 U/L (ref 15–41)
Albumin: 2.5 g/dL — ABNORMAL LOW (ref 3.5–5.0)
Alkaline Phosphatase: 50 U/L (ref 38–126)
Anion gap: 10 (ref 5–15)
BUN: 40 mg/dL — ABNORMAL HIGH (ref 6–20)
CO2: 28 mmol/L (ref 22–32)
Calcium: 7.5 mg/dL — ABNORMAL LOW (ref 8.9–10.3)
Chloride: 99 mmol/L (ref 98–111)
Creatinine, Ser: 1.85 mg/dL — ABNORMAL HIGH (ref 0.61–1.24)
GFR calc Af Amer: 47 mL/min — ABNORMAL LOW (ref >60)
GFR calc non Af Amer: 40 mL/min — ABNORMAL LOW (ref >60)
Glucose, Bld: 145 mg/dL — ABNORMAL HIGH (ref 70–99)
Potassium: 3.5 mmol/L (ref 3.5–5.1)
Sodium: 137 mmol/L (ref 135–145)
Total Bilirubin: 0.9 mg/dL (ref 0.3–1.2)
Total Protein: 6.6 g/dL (ref 6.5–8.1)

## 2019-09-15 LAB — MAGNESIUM: Magnesium: 2.1 mg/dL (ref 1.7–2.4)

## 2019-09-15 LAB — GLUCOSE, CAPILLARY
Glucose-Capillary: 287 mg/dL — ABNORMAL HIGH (ref 70–99)
Glucose-Capillary: 237 mg/dL — ABNORMAL HIGH (ref 70–99)
Glucose-Capillary: 309 mg/dL — ABNORMAL HIGH (ref 70–99)
Glucose-Capillary: 173 mg/dL — ABNORMAL HIGH (ref 70–99)

## 2019-09-15 LAB — D-DIMER, QUANTITATIVE: D-Dimer, Quant: 0.88 ug/mL-FEU — ABNORMAL HIGH (ref 0.00–0.50)

## 2019-09-15 LAB — BRAIN NATRIURETIC PEPTIDE: B Natriuretic Peptide: 40.9 pg/mL (ref 0.0–100.0)

## 2019-09-15 LAB — C-REACTIVE PROTEIN: CRP: 15.6 mg/dL — ABNORMAL HIGH (ref <1.0)

## 2019-09-15 LAB — PROCALCITONIN: Procalcitonin: 0.17 ng/mL

## 2019-09-15 LAB — NOVEL CORONAVIRUS, NAA: SARS-CoV-2, NAA: DETECTED — AB

## 2019-09-15 MED ORDER — INSULIN ASPART 100 UNIT/ML ~~LOC~~ SOLN
35.0000 [IU] | Freq: Once | SUBCUTANEOUS | Status: AC
  Administered 2019-09-15: 35 [IU] via SUBCUTANEOUS

## 2019-09-15 MED ORDER — POTASSIUM CHLORIDE CRYS ER 20 MEQ PO TBCR
40.0000 meq | EXTENDED_RELEASE_TABLET | Freq: Once | ORAL | Status: AC
  Administered 2019-09-15: 40 meq via ORAL
  Filled 2019-09-15: qty 2

## 2019-09-15 MED ORDER — METHYLPREDNISOLONE SODIUM SUCC 40 MG IJ SOLR
40.0000 mg | Freq: Two times a day (BID) | INTRAMUSCULAR | Status: DC
  Administered 2019-09-15 – 2019-09-17 (×6): 40 mg via INTRAVENOUS
  Filled 2019-09-15 (×7): qty 1

## 2019-09-15 MED ORDER — GUAIFENESIN-DM 100-10 MG/5ML PO SYRP
10.0000 mL | ORAL_SOLUTION | ORAL | Status: DC | PRN
  Administered 2019-09-15 – 2019-09-16 (×3): 10 mL via ORAL
  Filled 2019-09-15 (×3): qty 10

## 2019-09-15 MED ORDER — INSULIN ASPART 100 UNIT/ML ~~LOC~~ SOLN
25.0000 [IU] | Freq: Once | SUBCUTANEOUS | Status: AC
  Administered 2019-09-15: 25 [IU] via SUBCUTANEOUS

## 2019-09-15 NOTE — Progress Notes (Signed)
Patient reports that he spoke with his wife and updated her on his status

## 2019-09-15 NOTE — Progress Notes (Signed)
PROGRESS NOTE                                                                                                                                                                                                             Patient Demographics:    Taylor Lynch, is a 53 y.o. male, DOB - Aug 20, 1966, JOI:[id number]  Outpatient Primary MD for the patient is Patient, No Pcp Per    LOS - 1  Admit date - 09/13/2019    Chief complaint.  Shortness of breath     Brief Narrative  53 y.o.malewith medical history significant ofhypertension and type II diabetes who presented with increasing shortness of breath, further work-up suggested acute hypoxic respiratory failure due to COVID-19 pneumonia, he initially required 4 L nasal cannula oxygen was started on IV steroids and remdesivir but continued to get more hypoxic was eventually sent to [facility] on 7 L nasal cannula oxygen.   Subjective:   Patient in bed, appears comfortable, denies any headache, no fever, no chest pain or pressure, much improved shortness of breath , no abdominal pain. No focal weakness.   Assessment  & Plan :     1. Acute Hypoxic Resp. Failure due to Acute Covid 19 Viral Pneumonitis during the ongoing 2020 Covid 19 Pandemic - he had severe disease, currently requiring 7 L of oxygen, was unable to talk in full sentences and was working hard for breathing, CRP was greater than 20, CTA had ruled out PE, he was already on steroids and IV remdesivir which were continued, he was given a dose of Actemra on 09/14/2019 after appropriate consent, within 12 hours he has shown remarkable improvement he is down to 2 L nasal cannula oxygen and feeling better his only subjective complaint now is a nagging cough, inflammatory markers are coming down will be monitored closely.  Start tapering steroids gradually.  Encouraged the patient to sit up in chair in the daytime use I-S and flutter  valve for pulmonary toiletry and then prone in bed when at night.   SpO2: 98 % O2 Flow Rate (L/min): 2 L/min  Recent Labs  Lab 09/13/19 0000 09/13/19 1904 09/13/19 1940 09/14/19 1122 09/14/19 1323 09/15/19 0132  CRP  --   --   --  20.7*  --  15.6*  DDIMER  --   --  0.86*  --  0.82* 0.88*  BNP  --   --   --   --  35.8 40.9  PROCALCITON  --   --   --   --  0.16 0.17  SARSCOV2NAA Detected* POSITIVE*  --   --   --   --     Hepatic Function Latest Ref Rng & Units 09/15/2019 09/13/2019  Total Protein 6.5 - 8.1 g/dL 6.6 7.1  Albumin 3.5 - 5.0 g/dL 2.5(L) 2.6(L)  AST 15 - 41 U/L 34 49(H)  ALT 0 - 44 U/L 33 36  Alk Phosphatase 38 - 126 U/L 50 52  Total Bilirubin 0.3 - 1.2 mg/dL 0.9 0.8  Bilirubin, Direct 0.0 - 0.2 mg/dL - 0.2    2.  Essential hypertension.  Placed on Norvasc and hydralazine.  3.  COVID-19 gastroenteritis causing dehydration.  Gentle IV fluids for hydration.  4.  AKI versus CKD 4.  No previous creatinine on file.  Has been adequately hydrated.  Avoid renal toxins, creatinine has plateaued, good urine output, will follow with PCP and if needed outpatient nephrology.  5.  DM type II.  Poor outpatient control due to hyperglycemia Carma oral hypoglycemics on hold, with his underlying renal dysfunction will likely require insulin upon discharge, currently on Lantus, premeal NovoLog and ISS will monitor and adjust, steroids being tapered.  Lab Results  Component Value Date   HGBA1C 9.2 (H) 09/14/2019   CBG (last 3)  Recent Labs    09/14/19 1845 09/14/19 2101 09/15/19 0758  GLUCAP 366* 287* 237*        Condition - Extremely Guarded  Family Communication  :  None  Code Status :  Full  Diet :   Diet Order            Diet Heart Room service appropriate? Yes; Fluid consistency: Thin  Diet effective now               Disposition Plan  :  TBD  Consults  :  None  Procedures  :     CTA - 1. No CT evidence of pulmonary embolism. 2. Multifocal  pneumonia. Clinical correlation and follow-up to resolution recommended.  PUD Prophylaxis :    DVT Prophylaxis  :  Lovenox   Lab Results  Component Value Date   PLT 247 09/15/2019    Inpatient Medications  Scheduled Meds: . amLODipine  10 mg Oral Daily  . carvedilol  6.25 mg Oral BID WC  . dextromethorphan-guaiFENesin  1 tablet Oral BID  . enoxaparin (LOVENOX) injection  40 mg Subcutaneous Daily  . insulin aspart  0-15 Units Subcutaneous TID WC  . insulin aspart  0-5 Units Subcutaneous QHS  . insulin aspart  3 Units Subcutaneous TID WC  . insulin glargine  40 Units Subcutaneous Daily  . methylPREDNISolone (SOLU-MEDROL) injection  60 mg Intravenous Q12H  . simvastatin  20 mg Oral QPM  . timolol  1 drop Both Eyes BID   Continuous Infusions: . lactated ringers 75 mL/hr at 09/15/19 0551  . remdesivir 100 mg in NS 100 mL     PRN Meds:.acetaminophen, guaiFENesin-dextromethorphan, loperamide, ondansetron (ZOFRAN) IV  Antibiotics  :    Anti-infectives (From admission, onward)   Start     Dose/Rate Route Frequency Ordered Stop   09/15/19 1000  remdesivir 100 mg in sodium chloride 0.9 % 100 mL IVPB     100 mg 200 mL/hr over 30 Minutes Intravenous Daily 09/14/19 0150 09/19/19 0959   09/14/19 0230  remdesivir 200  mg in sodium chloride 0.9% 250 mL IVPB     200 mg 580 mL/hr over 30 Minutes Intravenous Once 09/14/19 0150 09/14/19 0456       Time Spent in minutes  30   Lala Lund M.D on 09/15/2019 at 10:11 AM  To page go to [email or web address] - password [email or web address]  [facility] -  Office  [phone]    See all Orders from today for further details    Objective:   Vitals:   09/14/19 1751 09/14/19 1915 09/15/19 0316 09/15/19 0800  BP: (!) 152/85 (!) 157/82 132/81 (!) 141/81  Pulse:  99 80 81  Resp:  20  16  Temp:  99.5 F (37.5 C) 99 F (37.2 C) 97.7 F (36.5 C)  TempSrc:  Oral Oral Oral  SpO2:  99% 98%   Weight:  94.2 kg    Height:  _0  (1.854 m)      Wt  Readings from Last 3 Encounters:  09/14/19 94.2 kg     Intake/Output Summary (Last 24 hours) at 09/15/2019 1011 Last data filed at 09/15/2019 0500 Gross per 24 hour  Intake 1352.98 ml  Output 1300 ml  Net 52.98 ml     Physical Exam  Awake Alert,  No new F.N deficits, Normal affect [place].AT,PERRAL Supple Neck,No JVD, No cervical lymphadenopathy appriciated.  Symmetrical Chest wall movement, Good air movement bilaterally, CTAB RRR,No Gallops, Rubs or new Murmurs, No Parasternal Heave +ve B.Sounds, Abd Soft, No tenderness, No organomegaly appriciated, No rebound - guarding or rigidity. No Cyanosis, Clubbing or edema, No new Rash or bruise    Data Review:    CBC Recent Labs  Lab 09/13/19 1804 09/14/19 0408 09/15/19 0132  WBC 8.2 9.8 7.7  HGB 14.0 13.6 12.9*  HCT 41.5 40.5 39.1  PLT 203 215 247  MCV 82.2 83.3 81.8  MCH 27.7 28.0 27.0  MCHC 33.7 33.6 33.0  RDW 14.6 14.6 14.5  LYMPHSABS  --   --  0.5*  MONOABS  --   --  0.5  EOSABS  --   --  0.0  BASOSABS  --   --  0.0    Chemistries  Recent Labs  Lab 09/13/19 1804 09/13/19 1850 09/14/19 0408 09/15/19 0132  NA 135  --  139 137  K 3.5  --  3.7 3.5  CL 96*  --  97* 99  CO2 27  --  30 28  GLUCOSE 348*  --  297* 145*  BUN 19  --  23* 40*  CREATININE 1.86*  --  1.90* 1.85*  CALCIUM 7.7*  --  7.8* 7.5*  MG  --   --   --  2.1  AST  --  49*  --  34  ALT  --  36  --  33  ALKPHOS  --  52  --  50  BILITOT  --  0.8  --  0.9   ------------------------------------------------------------------------------------------------------------------ No results for input(s): CHOL, HDL, LDLCALC, TRIG, CHOLHDL, LDLDIRECT in the last 72 hours.  Lab Results  Component Value Date   HGBA1C 9.2 (H) 09/14/2019   ------------------------------------------------------------------------------------------------------------------ No results for input(s): TSH, T4TOTAL, T3FREE, THYROIDAB in the last 72 hours.  Invalid input(s):  FREET3  Cardiac Enzymes No results for input(s): CKMB, TROPONINI, MYOGLOBIN in the last 168 hours.  Invalid input(s): CK ------------------------------------------------------------------------------------------------------------------    Component Value Date/Time   BNP 40.9 09/15/2019 0132    Micro Results Recent Results (from the past 240 hour(s))  Novel Coronavirus, NAA (  [facility])     Status: Abnormal   Collection Time: 09/13/19 12:00 AM   Specimen: Nasopharyngeal(NP) swabs in vial transport medium   NASOPHARYNGE  TESTING  Result Value Ref Range Status   SARS-CoV-2, NAA Detected (A) Not Detected Final    Comment: This nucleic acid amplification test was developed and its performance characteristics determined by [employer]. Nucleic acid amplification tests include PCR and TMA. This test has not been FDA cleared or approved. This test has been authorized by FDA under an Emergency Use Authorization (EUA). This test is only authorized for the duration of time the declaration that circumstances exist justifying the authorization of the emergency use of in vitro diagnostic tests for detection of SARS-CoV-2 virus and/or diagnosis of COVID-19 infection under section 564(b)(1) of the Act, 21 U.S.C. 196QIW-9(N) (1), unless the authorization is terminated or revoked sooner. When diagnostic testing is negative, the possibility of a false negative result should be considered in the context of a patient's recent exposures and the presence of clinical signs and symptoms consistent with COVID-19. An individual without symptoms of COVID-19 and who is not shedding SARS-CoV-2 virus would  expect to have a negative (not detected) result in this assay.   SARS CORONAVIRUS 2 (TAT 6-24 HRS) Nasopharyngeal Nasopharyngeal Swab     Status: Abnormal   Collection Time: 09/13/19  7:04 PM   Specimen: Nasopharyngeal Swab  Result Value Ref Range Status   SARS Coronavirus 2 POSITIVE (A) NEGATIVE  Final    Comment: RESULT CALLED TO, READ BACK BY AND VERIFIED WITH: Neldon Labella 0131 09/14/2019 D BRADLEY (NOTE) SARS-CoV-2 target nucleic acids are DETECTED. The SARS-CoV-2 RNA is generally detectable in upper and lower respiratory specimens during the acute phase of infection. Positive results are indicative of the presence of SARS-CoV-2 RNA. Clinical correlation with patient history and other diagnostic information is  necessary to determine patient infection status. Positive results do not rule out bacterial infection or co-infection with other viruses.  The expected result is Negative. Fact Sheet for Patients: SugarRoll.be Fact Sheet for Healthcare Providers: https://www.woods-mathews.com/ This test is not yet approved or cleared by the [place] FDA and  has been authorized for detection and/or diagnosis of SARS-CoV-2 by FDA under an Emergency Use Authorization (EUA). This EUA will remain  in effect (meaning this test can be used) for t he duration of the COVID-19 declaration under Section 564(b)(1) of the Act, 21 U.S.C. section 360bbb-3(b)(1), unless the authorization is terminated or revoked sooner. Performed at [facility], [place] [address], [place], [place] [address]   Respiratory Panel by PCR     Status: None   Collection Time: 09/14/19  4:08 AM   Specimen: Nasopharyngeal Swab; Respiratory  Result Value Ref Range Status   Adenovirus NOT DETECTED NOT DETECTED Final   Coronavirus 229E NOT DETECTED NOT DETECTED Final    Comment: (NOTE) The Coronavirus on the Respiratory Panel, DOES NOT test for the novel  Coronavirus (2019 nCoV)    Coronavirus HKU1 NOT DETECTED NOT DETECTED Final   Coronavirus NL63 NOT DETECTED NOT DETECTED Final   Coronavirus OC43 NOT DETECTED NOT DETECTED Final   Metapneumovirus NOT DETECTED NOT DETECTED Final   Rhinovirus / Enterovirus NOT DETECTED NOT DETECTED Final   Influenza A NOT DETECTED NOT  DETECTED Final   Influenza B NOT DETECTED NOT DETECTED Final   Parainfluenza Virus 1 NOT DETECTED NOT DETECTED Final   Parainfluenza Virus 2 NOT DETECTED NOT DETECTED Final   Parainfluenza Virus 3 NOT DETECTED  NOT DETECTED Final   Parainfluenza Virus 4 NOT DETECTED NOT DETECTED Final   Respiratory Syncytial Virus NOT DETECTED NOT DETECTED Final   Bordetella pertussis NOT DETECTED NOT DETECTED Final   Chlamydophila pneumoniae NOT DETECTED NOT DETECTED Final   Mycoplasma pneumoniae NOT DETECTED NOT DETECTED Final    Comment: Performed at [facility], [place] [address] [place], [place] [address]    Radiology Reports CT Angio Chest PE W and/or Wo Contrast  Result Date: 09/13/2019 CLINICAL DATA:  53 year old male with chest pain. Concern for pulmonary embolism. EXAM: CT ANGIOGRAPHY CHEST WITH CONTRAST TECHNIQUE: Multidetector CT imaging of the chest was performed using the standard protocol during bolus administration of intravenous contrast. Multiplanar CT image reconstructions and MIPs were obtained to evaluate the vascular anatomy. CONTRAST:  30m OMNIPAQUE IOHEXOL 350 MG/ML SOLN COMPARISON:  Chest radiograph dated 09/13/2019. FINDINGS: Cardiovascular: Top-normal cardiac size. No pericardial effusion. The thoracic aorta is unremarkable. No pulmonary artery embolism identified. Mediastinum/Nodes: No hilar or mediastinal adenopathy. The esophagus and the thyroid gland are grossly unremarkable. No mediastinal fluid collection or hematoma. Lungs/Pleura: Bilateral patchy airspace opacities most consistent with multifocal pneumonia, likely atypical or viral etiology. Clinical correlation and follow-up to resolution recommended. There is no pleural effusion or pneumothorax. The central airways are patent. Upper Abdomen: No acute abnormality. Musculoskeletal: No chest wall abnormality. No acute or significant osseous findings. Review of the MIP images confirms the above findings. IMPRESSION: 1. No CT  evidence of pulmonary embolism. 2. Multifocal pneumonia. Clinical correlation and follow-up to resolution recommended. Electronically Signed   By: AAnner CreteM.D.   On: 09/13/2019 22:51   DG Chest Portable 1 View  Result Date: 09/13/2019 CLINICAL DATA:  Shortness of breath, diabetes mellitus, hypertension EXAM: PORTABLE CHEST 1 VIEW COMPARISON:  Portable exam 1818 hours without priors for comparison FINDINGS: Upper normal heart size. Normal mediastinal contours and pulmonary vascularity. Patchy infiltrates at the inferior lungs bilaterally greater on LEFT question multifocal pneumonia, less likely atelectasis. Upper lungs clear. No pleural effusion or pneumothorax. IMPRESSION: Bibasilar opacities RIGHT greater than LEFT favor multifocal pneumonia over atelectasis. Electronically Signed   By: MLavonia DanaM.D.   On: 09/13/2019 19:09

## 2019-09-15 NOTE — Progress Notes (Signed)
Called patients wife, Bountou Cisse, and got no answer. Left message on voicemail to return call when able.

## 2019-09-15 NOTE — Progress Notes (Signed)
Patients wife, Bountou Cisse, was called to give an update on patients' status. Left voicemail (name and number) to call back.

## 2019-09-15 NOTE — Progress Notes (Signed)
MD notified of elevated blood glucose of 580. MD wants to give 35 units of Novolog total and recheck blood glucose at 1630.

## 2019-09-15 NOTE — Evaluation (Signed)
Physical Therapy Evaluation Patient Details Name: Taylor Lynch MRN: [id number] DOB: 04/22/1966 Today's Date: 09/15/2019   History of Present Illness  53 y.o. male with medical history significant of hypertension and type II diabetes who presented with increasing shortness of breath, further work-up suggested acute hypoxic respiratory failure due to COVID-19 pneumonia, he initially required 4 L nasal cannula oxygen was started on IV steroids and remdesivir but continued to get more hypoxic was eventually sent to [facility] on 7 L nasal cannula oxygen.  Clinical Impression  The patient  Received in recliner. Spo2 on 3 L 94% resting. . Patient ambulated x 30' and 8' on 3 L. Was never able to get SPO2 to register on either ear again. When placed on finger, SPO2 resting 77% with no obvious SOB.  Patient should progress to DC to home. Pt admitted with above diagnosis.  Pt currently with functional limitations due to the deficits listed below (see PT Problem List). Pt will benefit from skilled PT to increase their independence and safety with mobility to allow discharge to the venue listed below.       Follow Up Recommendations No PT follow up    Equipment Recommendations  None recommended by PT    Recommendations for Other Services       Precautions / Restrictions Precautions Precaution Comments: monitor sats, some dizziness, difficult to pick up on ear and finger reads very low without S/S      Mobility  Bed Mobility               General bed mobility comments: in recliner  Transfers Overall transfer level: Needs assistance Equipment used: Rolling walker (2 wheeled) Transfers: Sit to/from Stand Sit to Stand: Min guard         General transfer comment: cues for hand placement  Ambulation/Gait Ambulation/Gait assistance: Min assist;+2 safety/equipment Gait Distance (Feet): 30 Feet Assistive device: Rolling walker (2 wheeled) Gait Pattern/deviations: Step-to  pattern;Step-through pattern Gait velocity: decr   General Gait Details: cues for rolling RW.  gait steady with RW.  Stairs            Wheelchair Mobility    Modified Rankin (Stroke Patients Only)       Balance                                             Pertinent Vitals/Pain Pain Assessment: No/denies pain    Home Living Family/patient expects to be discharged to:: Private residence Living Arrangements: Spouse/significant other Available Help at Discharge: Family Type of Home: House       Home Layout: One level Home Equipment: None      Prior Function Level of Independence: Independent         Comments: works     Journalist, newspaper        Extremity/Trunk Assessment   Upper Extremity Assessment Upper Extremity Assessment: Generalized weakness    Lower Extremity Assessment Lower Extremity Assessment: Generalized weakness    Cervical / Trunk Assessment Cervical / Trunk Assessment: Normal  Communication   Communication: No difficulties  Cognition Arousal/Alertness: Awake/alert Behavior During Therapy: WFL for tasks assessed/performed Overall Cognitive Status: Within Functional Limits for tasks assessed  General Comments      Exercises General Exercises - Lower Extremity Hip Flexion/Marching: AROM;Both;10 reps;Standing Other Exercises Other Exercises: emonstrated TB and standing exercises   Assessment/Plan    PT Assessment Patient needs continued PT services  PT Problem List Decreased strength;Decreased mobility;Decreased safety awareness;Decreased knowledge of precautions;Decreased activity tolerance;Decreased cognition;Decreased balance;Decreased knowledge of use of DME       PT Treatment Interventions Therapeutic activities;DME instruction;Gait training;Therapeutic exercise;Patient/family education;Functional mobility training    PT Goals (Current goals can be  found in the Care Plan section)  Acute Rehab PT Goals Patient Stated Goal: to go home PT Goal Formulation: With patient Time For Goal Achievement: 09/30/19 Potential to Achieve Goals: Good    Frequency Min 3X/week   Barriers to discharge Decreased caregiver support      Co-evaluation               AM-PAC PT "6 Clicks" Mobility  Outcome Measure Help needed turning from your back to your side while in a flat bed without using bedrails?: A Little Help needed moving from lying on your back to sitting on the side of a flat bed without using bedrails?: A Little Help needed moving to and from a bed to a chair (including a wheelchair)?: A Little Help needed standing up from a chair using your arms (e.g., wheelchair or bedside chair)?: A Little Help needed to walk in hospital room?: A Little Help needed climbing 3-5 steps with a railing? : A Lot 6 Click Score: 17    End of Session Equipment Utilized During Treatment: Oxygen Activity Tolerance: Patient tolerated treatment well Patient left: in chair;with call bell/phone within reach Nurse Communication: Mobility status PT Visit Diagnosis: Unsteadiness on feet (R26.81);Difficulty in walking, not elsewhere classified (R26.2)    Time: [id number] PT Time Calculation (min) (ACUTE ONLY): 86 min   Charges:   PT Evaluation $PT Eval Moderate Complexity: 1 Mod PT Treatments $Gait Training: 23-37 mins $Self Care/Home Management: [place] Pager [phone] Office [phone]   Claretha Cooper 09/15/2019, 1:27 PM

## 2019-09-16 ENCOUNTER — Inpatient Hospital Stay (HOSPITAL_COMMUNITY): Payer: BC Managed Care – PPO

## 2019-09-16 DIAGNOSIS — R079 Chest pain, unspecified: Secondary | ICD-10-CM

## 2019-09-16 LAB — GLUCOSE, CAPILLARY
Glucose-Capillary: 114 mg/dL — ABNORMAL HIGH (ref 70–99)
Glucose-Capillary: 222 mg/dL — ABNORMAL HIGH (ref 70–99)
Glucose-Capillary: 228 mg/dL — ABNORMAL HIGH (ref 70–99)
Glucose-Capillary: 306 mg/dL — ABNORMAL HIGH (ref 70–99)

## 2019-09-16 LAB — COMPREHENSIVE METABOLIC PANEL
ALT: 38 U/L (ref 0–44)
AST: 35 U/L (ref 15–41)
Albumin: 2.6 g/dL — ABNORMAL LOW (ref 3.5–5.0)
Alkaline Phosphatase: 56 U/L (ref 38–126)
Anion gap: 12 (ref 5–15)
BUN: 42 mg/dL — ABNORMAL HIGH (ref 6–20)
CO2: 26 mmol/L (ref 22–32)
Calcium: 7.8 mg/dL — ABNORMAL LOW (ref 8.9–10.3)
Chloride: 101 mmol/L (ref 98–111)
Creatinine, Ser: 1.6 mg/dL — ABNORMAL HIGH (ref 0.61–1.24)
GFR calc Af Amer: 56 mL/min — ABNORMAL LOW (ref >60)
GFR calc non Af Amer: 48 mL/min — ABNORMAL LOW (ref >60)
Glucose, Bld: 212 mg/dL — ABNORMAL HIGH (ref 70–99)
Potassium: 3.6 mmol/L (ref 3.5–5.1)
Sodium: 139 mmol/L (ref 135–145)
Total Bilirubin: 0.8 mg/dL (ref 0.3–1.2)
Total Protein: 6.8 g/dL (ref 6.5–8.1)

## 2019-09-16 LAB — CBC WITH DIFFERENTIAL/PLATELET
Abs Immature Granulocytes: 0.26 10*3/uL — ABNORMAL HIGH (ref 0.00–0.07)
Basophils Absolute: 0 10*3/uL (ref 0.0–0.1)
Basophils Relative: 0 %
Eosinophils Absolute: 0 10*3/uL (ref 0.0–0.5)
Eosinophils Relative: 0 %
HCT: 40.9 % (ref 39.0–52.0)
Hemoglobin: 13.7 g/dL (ref 13.0–17.0)
Immature Granulocytes: 2 %
Lymphocytes Relative: 4 %
Lymphs Abs: 0.6 10*3/uL — ABNORMAL LOW (ref 0.7–4.0)
MCH: 27.6 pg (ref 26.0–34.0)
MCHC: 33.5 g/dL (ref 30.0–36.0)
MCV: 82.5 fL (ref 80.0–100.0)
Monocytes Absolute: 1 10*3/uL (ref 0.1–1.0)
Monocytes Relative: 6 %
Neutro Abs: 14.6 10*3/uL — ABNORMAL HIGH (ref 1.7–7.7)
Neutrophils Relative %: 88 %
Platelets: 319 10*3/uL (ref 150–400)
RBC: 4.96 MIL/uL (ref 4.22–5.81)
RDW: 14.6 % (ref 11.5–15.5)
WBC: 16.5 10*3/uL — ABNORMAL HIGH (ref 4.0–10.5)
nRBC: 0.2 % (ref 0.0–0.2)

## 2019-09-16 LAB — BRAIN NATRIURETIC PEPTIDE: B Natriuretic Peptide: 65.2 pg/mL (ref 0.0–100.0)

## 2019-09-16 LAB — ECHOCARDIOGRAM COMPLETE
Height: 73 in
Weight: 3322.77 oz

## 2019-09-16 LAB — C-REACTIVE PROTEIN: CRP: 3.7 mg/dL — ABNORMAL HIGH (ref <1.0)

## 2019-09-16 LAB — TROPONIN I (HIGH SENSITIVITY): Troponin I (High Sensitivity): 7 ng/L (ref <18)

## 2019-09-16 LAB — D-DIMER, QUANTITATIVE: D-Dimer, Quant: 2.98 ug/mL-FEU — ABNORMAL HIGH (ref 0.00–0.50)

## 2019-09-16 LAB — PROCALCITONIN: Procalcitonin: 0.15 ng/mL

## 2019-09-16 LAB — MAGNESIUM: Magnesium: 2.3 mg/dL (ref 1.7–2.4)

## 2019-09-16 MED ORDER — FUROSEMIDE 10 MG/ML IJ SOLN
40.0000 mg | Freq: Once | INTRAMUSCULAR | Status: AC
  Administered 2019-09-16: 40 mg via INTRAVENOUS
  Filled 2019-09-16: qty 4

## 2019-09-16 MED ORDER — INSULIN ASPART 100 UNIT/ML ~~LOC~~ SOLN
8.0000 [IU] | Freq: Three times a day (TID) | SUBCUTANEOUS | Status: DC
  Administered 2019-09-16 – 2019-09-19 (×10): 8 [IU] via SUBCUTANEOUS

## 2019-09-16 MED ORDER — DICLOFENAC SODIUM 1 % EX GEL
2.0000 g | Freq: Four times a day (QID) | CUTANEOUS | Status: AC
  Administered 2019-09-16 – 2019-09-17 (×6): 2 g via TOPICAL
  Filled 2019-09-16: qty 100

## 2019-09-16 MED ORDER — INSULIN GLARGINE 100 UNIT/ML ~~LOC~~ SOLN
50.0000 [IU] | Freq: Every day | SUBCUTANEOUS | Status: DC
  Administered 2019-09-16 – 2019-09-18 (×3): 50 [IU] via SUBCUTANEOUS
  Filled 2019-09-16 (×4): qty 0.5

## 2019-09-16 MED ORDER — TRAMADOL HCL 50 MG PO TABS
50.0000 mg | ORAL_TABLET | Freq: Three times a day (TID) | ORAL | Status: AC
  Administered 2019-09-16 – 2019-09-17 (×5): 50 mg via ORAL
  Filled 2019-09-16 (×5): qty 1

## 2019-09-16 MED ORDER — TRAMADOL HCL 50 MG PO TABS
50.0000 mg | ORAL_TABLET | Freq: Two times a day (BID) | ORAL | Status: DC
  Administered 2019-09-16: 50 mg via ORAL
  Filled 2019-09-16: qty 1

## 2019-09-16 NOTE — Progress Notes (Signed)
PROGRESS NOTE                                                                                                                                                                                                             Patient Demographics:    Taylor Lynch, is a 53 y.o. male, DOB - [birth year], GNF:[id number]  Outpatient Primary MD for the patient is Patient, No Pcp Per    LOS - 2  Admit date - 09/13/2019    Chief complaint.  Shortness of breath     Brief Narrative  53 y.o.malewith medical history significant ofhypertension and type II diabetes who presented with increasing shortness of breath, further work-up suggested acute hypoxic respiratory failure due to COVID-19 pneumonia, he initially required 4 L nasal cannula oxygen was started on IV steroids and remdesivir but continued to get more hypoxic was eventually sent to [facility] on 7 L nasal cannula oxygen.   Subjective:   Patient in bed, appears comfortable, denies any headache, no fever, is having substernal pleuritic chest pain preventing him from taking deep breaths, no shortness of breath , no abdominal pain. No focal weakness.   Assessment  & Plan :     1. Acute Hypoxic Resp. Failure due to Acute Covid 19 Viral Pneumonitis during the ongoing 2020 Covid 19 Pandemic - he had severe disease, currently requiring 7 L of oxygen, was unable to talk in full sentences and was working hard for breathing, CRP was greater than 20, CTA had ruled out PE, he was already on steroids and IV remdesivir which were continued, he was given a dose of Actemra on 09/14/2019 after appropriate consent, within 12 hours he has shown remarkable improvement he is down to 2 L nasal cannula oxygen and feeling better, continue tapering steroids.  Note patient has pleuritic chest pain which is preventing him from taking deep breaths and causing falls hypoxia, applied BenGay cream and gave him 1 dose of  tramadol after which his oxygen requirement has gone down from 15 L to 2 L.  Nursing staff educated.  D-dimer and CTA stable will get an echocardiogram to rule out pericarditis, cycle Trop.   Encouraged the patient to sit up in chair in the daytime use I-S and flutter valve for pulmonary toiletry and then prone in bed when at night.   SpO2: 92 %  O2 Flow Rate (L/min): 2 L/min  Recent Labs  Lab 09/13/19 0000 09/13/19 1904 09/13/19 1940 09/14/19 1122 09/14/19 1323 09/15/19 0132  CRP  --   --   --  20.7*  --  15.6*  DDIMER  --   --  0.86*  --  0.82* 0.88*  BNP  --   --   --   --  35.8 40.9  PROCALCITON  --   --   --   --  0.16 0.17  SARSCOV2NAA Detected* POSITIVE*  --   --   --   --     Hepatic Function Latest Ref Rng & Units 09/15/2019 09/13/2019  Total Protein 6.5 - 8.1 g/dL 6.6 7.1  Albumin 3.5 - 5.0 g/dL 2.5(L) 2.6(L)  AST 15 - 41 U/L 34 49(H)  ALT 0 - 44 U/L 33 36  Alk Phosphatase 38 - 126 U/L 50 52  Total Bilirubin 0.3 - 1.2 mg/dL 0.9 0.8  Bilirubin, Direct 0.0 - 0.2 mg/dL - 0.2    2.  Essential hypertension.  Placed on Norvasc and hydralazine.  3.  COVID-19 gastroenteritis causing dehydration.  Gentle IV fluids for hydration.  4.  AKI versus CKD 4.  No previous creatinine on file.  Has been adequately hydrated.  Avoid renal toxins, creatinine has plateaued, good urine output, will follow with PCP and if needed outpatient nephrology.  5.  DM type II.  Poor outpatient control due to hyperglycemia Carma oral hypoglycemics on hold, with his underlying renal dysfunction will likely require insulin upon discharge, currently on Lantus, premeal NovoLog and ISS will monitor and adjust, steroids being tapered.  Lab Results  Component Value Date   HGBA1C 9.2 (H) 09/14/2019   CBG (last 3)  Recent Labs    09/15/19 1657 09/15/19 2127 09/16/19 0727  GLUCAP 309* 173* 114*      Condition - Extremely Guarded  Family Communication  :  None  Code Status :  Full  Diet :    Diet Order            Diet Heart Room service appropriate? Yes; Fluid consistency: Thin  Diet effective now               Disposition Plan  :  TBD  Consults  :  None  Procedures  :     TTE  CTA - 1. No CT evidence of pulmonary embolism. 2. Multifocal pneumonia. Clinical correlation and follow-up to resolution recommended.  PUD Prophylaxis :    DVT Prophylaxis  :  Lovenox   Lab Results  Component Value Date   PLT 247 09/15/2019    Inpatient Medications  Scheduled Meds: . amLODipine  10 mg Oral Daily  . carvedilol  6.25 mg Oral BID WC  . dextromethorphan-guaiFENesin  1 tablet Oral BID  . diclofenac Sodium  2 g Topical QID  . enoxaparin (LOVENOX) injection  40 mg Subcutaneous Daily  . insulin aspart  0-15 Units Subcutaneous TID WC  . insulin aspart  0-5 Units Subcutaneous QHS  . insulin aspart  8 Units Subcutaneous TID WC  . insulin glargine  50 Units Subcutaneous Daily  . methylPREDNISolone (SOLU-MEDROL) injection  40 mg Intravenous Q12H  . simvastatin  20 mg Oral QPM  . timolol  1 drop Both Eyes BID  . traMADol  50 mg Oral Q12H   Continuous Infusions: . remdesivir 100 mg in NS 100 mL 100 mg (09/16/19 0825)   PRN Meds:.acetaminophen, guaiFENesin-dextromethorphan, loperamide, ondansetron (ZOFRAN) IV  Antibiotics  :  Anti-infectives (From admission, onward)   Start     Dose/Rate Route Frequency Ordered Stop   09/15/19 1000  remdesivir 100 mg in sodium chloride 0.9 % 100 mL IVPB     100 mg 200 mL/hr over 30 Minutes Intravenous Daily 09/14/19 0150 09/19/19 0959   09/14/19 0230  remdesivir 200 mg in sodium chloride 0.9% 250 mL IVPB     200 mg 580 mL/hr over 30 Minutes Intravenous Once 09/14/19 0150 09/14/19 0456       Time Spent in minutes  30   Lala Lund M.D on 09/16/2019 at 10:57 AM  To page go to [email or web address] - password [facility]  [facility] -  Office  [phone]    See all Orders from today for further details    Objective:    Vitals:   09/16/19 0425 09/16/19 0729 09/16/19 0945 09/16/19 1030  BP: (!) 143/87 135/88    Pulse: 77  76   Resp: 16 20 (!) 21   Temp: 98.4 F (36.9 C) 98.1 F (36.7 C)    TempSrc: Oral Oral    SpO2: 95%   92%  Weight:      Height:        Wt Readings from Last 3 Encounters:  09/14/19 94.2 kg     Intake/Output Summary (Last 24 hours) at 09/16/2019 1057 Last data filed at 09/15/2019 2100 Gross per 24 hour  Intake 1040 ml  Output --  Net 1040 ml     Physical Exam  Awake Alert,   No new F.N deficits, Normal affect [place].AT,PERRAL Supple Neck,No JVD, No cervical lymphadenopathy appriciated.  Symmetrical Chest wall movement, Good air movement bilaterally, CTAB RRR,No Gallops, Rubs or new Murmurs, No Parasternal Heave +ve B.Sounds, Abd Soft, No tenderness, No organomegaly appriciated, No rebound - guarding or rigidity. No Cyanosis, Clubbing or edema, No new Rash or bruise     Data Review:    CBC Recent Labs  Lab 09/13/19 1804 09/14/19 0408 09/15/19 0132  WBC 8.2 9.8 7.7  HGB 14.0 13.6 12.9*  HCT 41.5 40.5 39.1  PLT 203 215 247  MCV 82.2 83.3 81.8  MCH 27.7 28.0 27.0  MCHC 33.7 33.6 33.0  RDW 14.6 14.6 14.5  LYMPHSABS  --   --  0.5*  MONOABS  --   --  0.5  EOSABS  --   --  0.0  BASOSABS  --   --  0.0    Chemistries  Recent Labs  Lab 09/13/19 1804 09/13/19 1850 09/14/19 0408 09/15/19 0132  NA 135  --  139 137  K 3.5  --  3.7 3.5  CL 96*  --  97* 99  CO2 27  --  30 28  GLUCOSE 348*  --  297* 145*  BUN 19  --  23* 40*  CREATININE 1.86*  --  1.90* 1.85*  CALCIUM 7.7*  --  7.8* 7.5*  MG  --   --   --  2.1  AST  --  49*  --  34  ALT  --  36  --  33  ALKPHOS  --  52  --  50  BILITOT  --  0.8  --  0.9   ------------------------------------------------------------------------------------------------------------------ No results for input(s): CHOL, HDL, LDLCALC, TRIG, CHOLHDL, LDLDIRECT in the last 72 hours.  Lab Results  Component Value Date    HGBA1C 9.2 (H) 09/14/2019   ------------------------------------------------------------------------------------------------------------------ No results for input(s): TSH, T4TOTAL, T3FREE, THYROIDAB in the last 72 hours.  Invalid input(s):  FREET3  Cardiac Enzymes No results for input(s): CKMB, TROPONINI, MYOGLOBIN in the last 168 hours.  Invalid input(s): CK ------------------------------------------------------------------------------------------------------------------    Component Value Date/Time   BNP 40.9 09/15/2019 0132    Micro Results Recent Results (from the past 240 hour(s))  Novel Coronavirus, NAA ([facility])     Status: Abnormal   Collection Time: 09/13/19 12:00 AM   Specimen: Nasopharyngeal(NP) swabs in vial transport medium   NASOPHARYNGE  TESTING  Result Value Ref Range Status   SARS-CoV-2, NAA Detected (A) Not Detected Final    Comment: This nucleic acid amplification test was developed and its performance characteristics determined by [employer]. Nucleic acid amplification tests include PCR and TMA. This test has not been FDA cleared or approved. This test has been authorized by FDA under an Emergency Use Authorization (EUA). This test is only authorized for the duration of time the declaration that circumstances exist justifying the authorization of the emergency use of in vitro diagnostic tests for detection of SARS-CoV-2 virus and/or diagnosis of COVID-19 infection under section 564(b)(1) of the Act, 21 U.S.C. 952WUX-3(K) (1), unless the authorization is terminated or revoked sooner. When diagnostic testing is negative, the possibility of a false negative result should be considered in the context of a patient's recent exposures and the presence of clinical signs and symptoms consistent with COVID-19. An individual without symptoms of COVID-19 and who is not shedding SARS-CoV-2 virus would  expect to have a negative (not detected) result in this  assay.   SARS CORONAVIRUS 2 (TAT 6-24 HRS) Nasopharyngeal Nasopharyngeal Swab     Status: Abnormal   Collection Time: 09/13/19  7:04 PM   Specimen: Nasopharyngeal Swab  Result Value Ref Range Status   SARS Coronavirus 2 POSITIVE (A) NEGATIVE Final    Comment: RESULT CALLED TO, READ BACK BY AND VERIFIED WITH: Neldon Labella 0131 09/14/2019 D BRADLEY (NOTE) SARS-CoV-2 target nucleic acids are DETECTED. The SARS-CoV-2 RNA is generally detectable in upper and lower respiratory specimens during the acute phase of infection. Positive results are indicative of the presence of SARS-CoV-2 RNA. Clinical correlation with patient history and other diagnostic information is  necessary to determine patient infection status. Positive results do not rule out bacterial infection or co-infection with other viruses.  The expected result is Negative. Fact Sheet for Patients: SugarRoll.be Fact Sheet for Healthcare Providers: https://www.woods-mathews.com/ This test is not yet approved or cleared by the [place] FDA and  has been authorized for detection and/or diagnosis of SARS-CoV-2 by FDA under an Emergency Use Authorization (EUA). This EUA will remain  in effect (meaning this test can be used) for t he duration of the COVID-19 declaration under Section 564(b)(1) of the Act, 21 U.S.C. section 360bbb-3(b)(1), unless the authorization is terminated or revoked sooner. Performed at [facility], [place] [address], [place], [place] [address]   Respiratory Panel by PCR     Status: None   Collection Time: 09/14/19  4:08 AM   Specimen: Nasopharyngeal Swab; Respiratory  Result Value Ref Range Status   Adenovirus NOT DETECTED NOT DETECTED Final   Coronavirus 229E NOT DETECTED NOT DETECTED Final    Comment: (NOTE) The Coronavirus on the Respiratory Panel, DOES NOT test for the novel  Coronavirus (2019 nCoV)    Coronavirus HKU1 NOT DETECTED NOT DETECTED Final     Coronavirus NL63 NOT DETECTED NOT DETECTED Final   Coronavirus OC43 NOT DETECTED NOT DETECTED Final   Metapneumovirus NOT DETECTED NOT DETECTED Final   Rhinovirus / Enterovirus  NOT DETECTED NOT DETECTED Final   Influenza A NOT DETECTED NOT DETECTED Final   Influenza B NOT DETECTED NOT DETECTED Final   Parainfluenza Virus 1 NOT DETECTED NOT DETECTED Final   Parainfluenza Virus 2 NOT DETECTED NOT DETECTED Final   Parainfluenza Virus 3 NOT DETECTED NOT DETECTED Final   Parainfluenza Virus 4 NOT DETECTED NOT DETECTED Final   Respiratory Syncytial Virus NOT DETECTED NOT DETECTED Final   Bordetella pertussis NOT DETECTED NOT DETECTED Final   Chlamydophila pneumoniae NOT DETECTED NOT DETECTED Final   Mycoplasma pneumoniae NOT DETECTED NOT DETECTED Final    Comment: Performed at [facility], [place] [address] [place], [place] [address]    Radiology Reports CT Angio Chest PE W and/or Wo Contrast  Result Date: 09/13/2019 CLINICAL DATA:  53 year old male with chest pain. Concern for pulmonary embolism. EXAM: CT ANGIOGRAPHY CHEST WITH CONTRAST TECHNIQUE: Multidetector CT imaging of the chest was performed using the standard protocol during bolus administration of intravenous contrast. Multiplanar CT image reconstructions and MIPs were obtained to evaluate the vascular anatomy. CONTRAST:  81m OMNIPAQUE IOHEXOL 350 MG/ML SOLN COMPARISON:  Chest radiograph dated 09/13/2019. FINDINGS: Cardiovascular: Top-normal cardiac size. No pericardial effusion. The thoracic aorta is unremarkable. No pulmonary artery embolism identified. Mediastinum/Nodes: No hilar or mediastinal adenopathy. The esophagus and the thyroid gland are grossly unremarkable. No mediastinal fluid collection or hematoma. Lungs/Pleura: Bilateral patchy airspace opacities most consistent with multifocal pneumonia, likely atypical or viral etiology. Clinical correlation and follow-up to resolution recommended. There is no pleural effusion or  pneumothorax. The central airways are patent. Upper Abdomen: No acute abnormality. Musculoskeletal: No chest wall abnormality. No acute or significant osseous findings. Review of the MIP images confirms the above findings. IMPRESSION: 1. No CT evidence of pulmonary embolism. 2. Multifocal pneumonia. Clinical correlation and follow-up to resolution recommended. Electronically Signed   By: AAnner CreteM.D.   On: 09/13/2019 22:51   DG Chest Port 1 View  Result Date: 09/16/2019 CLINICAL DATA:  Shortness of breath and COVID-19 pneumonia. EXAM: PORTABLE CHEST 1 VIEW COMPARISON:  09/13/2019 FINDINGS: The heart size and mediastinal contours are within normal limits. Lung volumes are low bilaterally. Bilateral pulmonary infiltrates predominantly in a lower lung zone distribution appear fairly stable without significant progression since the prior chest x-ray. Upper lung zones remain relatively well aerated. No overt pulmonary edema, pneumothorax or pleural fluid identified. The visualized skeletal structures are unremarkable. IMPRESSION: No significant change in bilateral pulmonary infiltrates since the prior chest x-ray. Electronically Signed   By: GAletta EdouardM.D.   On: 09/16/2019 09:29   DG Chest Portable 1 View  Result Date: 09/13/2019 CLINICAL DATA:  Shortness of breath, diabetes mellitus, hypertension EXAM: PORTABLE CHEST 1 VIEW COMPARISON:  Portable exam 1818 hours without priors for comparison FINDINGS: Upper normal heart size. Normal mediastinal contours and pulmonary vascularity. Patchy infiltrates at the inferior lungs bilaterally greater on LEFT question multifocal pneumonia, less likely atelectasis. Upper lungs clear. No pleural effusion or pneumothorax. IMPRESSION: Bibasilar opacities RIGHT greater than LEFT favor multifocal pneumonia over atelectasis. Electronically Signed   By: MLavonia DanaM.D.   On: 09/13/2019 19:09   ECHOCARDIOGRAM COMPLETE  Result Date: 09/16/2019   ECHOCARDIOGRAM  REPORT   Patient Name:   MLOTHAR PREHNDate of Exam: 09/16/2019 Medical Rec #:  [id number]       Height:       73.0 in Accession #:    [id number]      Weight:  207.7 lb Date of Birth:  [birth year]        BSA:          2.19 m Patient Age:    43 years         BP:           135/88 mmHg Patient Gender: M                HR:           102 bpm. Exam Location:  Inpatient Procedure: 2D Echo Indications:    Chest Pain 786.50 / R07.9  History:        Patient has no prior history of Echocardiogram examinations.                 Risk Factors:Hypertension and Diabetes. Covid 19. Acute Hypoxic                 Resp. Failure.  Sonographer:    Darlina Sicilian RDCS Referring Phys: [id number] Margaree Mackintosh [facility]  Sonographer Comments: [place]  1. Left ventricular ejection fraction, by visual estimation, is 60 to 65%. The left ventricle has normal function. There is moderately increased left ventricular hypertrophy.  2. Left ventricular diastolic parameters are consistent with Grade I diastolic dysfunction (impaired relaxation).  3. The left ventricle has no regional wall motion abnormalities.  4. Global right ventricle was not well visualized.The right ventricular size is not well visualized. Right vetricular wall thickness was not assessed.  5. Left atrial size was normal.  6. Right atrial size was not well visualized.  7. The mitral valve is grossly normal. No evidence of mitral valve regurgitation.  8. The tricuspid valve is not well visualized.  9. The aortic valve is tricuspid. Aortic valve regurgitation is not visualized. No evidence of aortic valve sclerosis or stenosis. 10. The pulmonic valve was grossly normal. Pulmonic valve regurgitation is not visualized. 11. Aortic dilatation noted. 12. There is mild dilatation of the ascending aorta. 13. The interatrial septum was not well visualized. FINDINGS  Left Ventricle: Left ventricular ejection fraction, by visual estimation, is 60 to 65%. The left ventricle has  normal function. The left ventricle has no regional wall motion abnormalities. There is moderately increased left ventricular hypertrophy. Concentric left ventricular hypertrophy. Left ventricular diastolic parameters are consistent with Grade I diastolic dysfunction (impaired relaxation). Normal left atrial pressure. Right Ventricle: The right ventricular size is not well visualized. Right vetricular wall thickness was not assessed. Global RV systolic function is was not well visualized. Left Atrium: Left atrial size was normal in size. Right Atrium: Right atrial size was not well visualized Pericardium: There is no evidence of pericardial effusion. Mitral Valve: The mitral valve is grossly normal. No evidence of mitral valve regurgitation. Tricuspid Valve: The tricuspid valve is not well visualized. Tricuspid valve regurgitation is not demonstrated. Aortic Valve: The aortic valve is tricuspid. Aortic valve regurgitation is not visualized. The aortic valve is structurally normal, with no evidence of sclerosis or stenosis. Pulmonic Valve: The pulmonic valve was grossly normal. Pulmonic valve regurgitation is not visualized. Pulmonic regurgitation is not visualized. Aorta: The aortic root is normal in size and structure and aortic dilatation noted. There is mild dilatation of the ascending aorta. Venous: The inferior vena cava was not well visualized. IAS/Shunts: The interatrial septum was not well visualized.  LEFT VENTRICLE PLAX 2D LVIDd:         3.58 cm  Diastology LVIDs:  2.28 cm  LV e' lateral:   5.11 cm/s LV PW:         1.44 cm  LV E/e' lateral: 9.8 LV IVS:        1.11 cm  LV e' medial:    5.77 cm/s LVOT diam:     2.10 cm  LV E/e' medial:  8.7 LV SV:         36 ml LV SV Index:   16.23 LVOT Area:     3.46 cm  LEFT ATRIUM           Index LA diam:      3.10 cm 1.42 cm/m LA Vol (A4C): 33.3 ml 15.23 ml/m   AORTA Ao Root diam: 3.40 cm Ao Asc diam:  3.70 cm MITRAL VALVE MV Area (PHT): 3.60 cm              SHUNTS MV PHT:        61.19 msec           Systemic Diam: 2.10 cm MV Decel Time: 211 msec MV E velocity: 50.00 cm/s 103 cm/s MV A velocity: 70.30 cm/s 70.3 cm/s MV E/A ratio:  0.71       1.5  Kate Sable MD Electronically signed by Kate Sable MD Signature Date/Time: 09/16/2019/9:38:42 AM    Final

## 2019-09-16 NOTE — Plan of Care (Signed)
  Problem: Education: Goal: Knowledge of risk factors and measures for prevention of condition will improve Outcome: Progressing   Problem: Coping: Goal: Psychosocial and spiritual needs will be supported Outcome: Progressing   Problem: Respiratory: Goal: Will maintain a patent airway Outcome: Progressing Goal: Complications related to the disease process, condition or treatment will be avoided or minimized Outcome: Progressing   

## 2019-09-16 NOTE — Progress Notes (Signed)
  Echocardiogram 2D Echocardiogram has been performed.  Darlina Sicilian M 09/16/2019, 8:32 AM

## 2019-09-17 LAB — COMPREHENSIVE METABOLIC PANEL
ALT: 39 U/L (ref 0–44)
AST: 27 U/L (ref 15–41)
Albumin: 2.4 g/dL — ABNORMAL LOW (ref 3.5–5.0)
Alkaline Phosphatase: 62 U/L (ref 38–126)
Anion gap: 12 (ref 5–15)
BUN: 41 mg/dL — ABNORMAL HIGH (ref 6–20)
CO2: 27 mmol/L (ref 22–32)
Calcium: 7.9 mg/dL — ABNORMAL LOW (ref 8.9–10.3)
Chloride: 100 mmol/L (ref 98–111)
Creatinine, Ser: 1.51 mg/dL — ABNORMAL HIGH (ref 0.61–1.24)
GFR calc Af Amer: 60 mL/min — ABNORMAL LOW (ref >60)
GFR calc non Af Amer: 52 mL/min — ABNORMAL LOW (ref >60)
Glucose, Bld: 218 mg/dL — ABNORMAL HIGH (ref 70–99)
Potassium: 3.8 mmol/L (ref 3.5–5.1)
Sodium: 139 mmol/L (ref 135–145)
Total Bilirubin: 0.6 mg/dL (ref 0.3–1.2)
Total Protein: 6.3 g/dL — ABNORMAL LOW (ref 6.5–8.1)

## 2019-09-17 LAB — CBC WITH DIFFERENTIAL/PLATELET
Abs Immature Granulocytes: 0.28 10*3/uL — ABNORMAL HIGH (ref 0.00–0.07)
Basophils Absolute: 0 10*3/uL (ref 0.0–0.1)
Basophils Relative: 0 %
Eosinophils Absolute: 0 10*3/uL (ref 0.0–0.5)
Eosinophils Relative: 0 %
HCT: 41.1 % (ref 39.0–52.0)
Hemoglobin: 13.8 g/dL (ref 13.0–17.0)
Immature Granulocytes: 2 %
Lymphocytes Relative: 5 %
Lymphs Abs: 0.8 10*3/uL (ref 0.7–4.0)
MCH: 27.3 pg (ref 26.0–34.0)
MCHC: 33.6 g/dL (ref 30.0–36.0)
MCV: 81.4 fL (ref 80.0–100.0)
Monocytes Absolute: 1.3 10*3/uL — ABNORMAL HIGH (ref 0.1–1.0)
Monocytes Relative: 8 %
Neutro Abs: 14.7 10*3/uL — ABNORMAL HIGH (ref 1.7–7.7)
Neutrophils Relative %: 85 %
Platelets: 349 10*3/uL (ref 150–400)
RBC: 5.05 MIL/uL (ref 4.22–5.81)
RDW: 14.6 % (ref 11.5–15.5)
WBC: 17.2 10*3/uL — ABNORMAL HIGH (ref 4.0–10.5)
nRBC: 0.4 % — ABNORMAL HIGH (ref 0.0–0.2)

## 2019-09-17 LAB — MAGNESIUM: Magnesium: 2.4 mg/dL (ref 1.7–2.4)

## 2019-09-17 LAB — D-DIMER, QUANTITATIVE: D-Dimer, Quant: 1.39 ug/mL-FEU — ABNORMAL HIGH (ref 0.00–0.50)

## 2019-09-17 LAB — GLUCOSE, CAPILLARY
Glucose-Capillary: 160 mg/dL — ABNORMAL HIGH (ref 70–99)
Glucose-Capillary: 397 mg/dL — ABNORMAL HIGH (ref 70–99)
Glucose-Capillary: 284 mg/dL — ABNORMAL HIGH (ref 70–99)
Glucose-Capillary: 333 mg/dL — ABNORMAL HIGH (ref 70–99)

## 2019-09-17 LAB — C-REACTIVE PROTEIN: CRP: 1.9 mg/dL — ABNORMAL HIGH (ref <1.0)

## 2019-09-17 LAB — BRAIN NATRIURETIC PEPTIDE: B Natriuretic Peptide: 87.9 pg/mL (ref 0.0–100.0)

## 2019-09-17 MED ORDER — LIVING WELL WITH DIABETES BOOK
Freq: Once | Status: AC
  Filled 2019-09-17: qty 1

## 2019-09-17 MED ORDER — INSULIN STARTER KIT- PEN NEEDLES (ENGLISH)
1.0000 | Freq: Once | Status: AC
  Administered 2019-09-17: 1
  Filled 2019-09-17: qty 1

## 2019-09-17 MED ORDER — FUROSEMIDE 10 MG/ML IJ SOLN
40.0000 mg | Freq: Once | INTRAMUSCULAR | Status: AC
  Administered 2019-09-17: 40 mg via INTRAVENOUS
  Filled 2019-09-17: qty 4

## 2019-09-17 NOTE — Progress Notes (Signed)
PROGRESS NOTE                                                                                                                                                                                                             Patient Demographics:    Mujtaba Bollig, is a 53 y.o. male, DOB - [birth year], RWE:[id number]  Outpatient Primary MD for the patient is Patient, No Pcp Per    LOS - 3  Admit date - 09/13/2019    Chief complaint.  Shortness of breath     Brief Narrative  53 y.o.malewith medical history significant ofhypertension and type II diabetes who presented with increasing shortness of breath, further work-up suggested acute hypoxic respiratory failure due to COVID-19 pneumonia, he initially required 4 L nasal cannula oxygen was started on IV steroids and remdesivir but continued to get more hypoxic was eventually sent to [facility] on 7 L nasal cannula oxygen.   Subjective:   Patient in bed, appears comfortable, no headache, no abd pain, improved but still +ve substernal pleuritic chest pain preventing him from taking deep breaths, no weakness.   Assessment  & Plan :     1. Acute Hypoxic Resp. Failure due to Acute Covid 19 Viral Pneumonitis during the ongoing 2020 Covid 19 Pandemic - he had severe disease, currently requiring 7 L of oxygen, was unable to talk in full sentences and was working hard for breathing, CRP was greater than 20, CTA had ruled out PE, he was already on steroids and IV remdesivir which were continued, he was given a dose of Actemra on 09/14/2019 after appropriate consent, within 12 hours he has shown remarkable improvement he is down to 2 L nasal cannula oxygen and feeling better, continue tapering steroids.  Note patient has pleuritic chest pain which is preventing him from taking deep breaths and causing falls hypoxia, applied BenGay cream and gave him 1 dose of tramadol after which his oxygen  requirement has gone down from 15 L to 2 L.  Nursing staff educated.  D-dimer and CTA stable, stable echocardiogram & -ve Trop.   Encouraged the patient to sit up in chair in the daytime use I-S and flutter valve for pulmonary toiletry and then prone in bed when at night.   SpO2: 96 % O2 Flow Rate (L/min): 2.5 L/min  Recent Labs  Lab 09/13/19  0000 09/13/19 1904 09/13/19 1940 09/14/19 1122 09/14/19 1323 09/15/19 0132 09/16/19 1015 09/17/19 0224  CRP  --   --   --  20.7*  --  15.6* 3.7* 1.9*  DDIMER  --   --  0.86*  --  0.82* 0.88* 2.98* 1.39*  BNP  --   --   --   --  35.8 40.9 65.2 87.9  PROCALCITON  --   --   --   --  0.16 0.17 0.15  --   SARSCOV2NAA Detected* POSITIVE*  --   --   --   --   --   --     Hepatic Function Latest Ref Rng & Units 09/17/2019 09/16/2019 09/15/2019  Total Protein 6.5 - 8.1 g/dL 6.3(L) 6.8 6.6  Albumin 3.5 - 5.0 g/dL 2.4(L) 2.6(L) 2.5(L)  AST 15 - 41 U/L 27 35 34  ALT 0 - 44 U/L 39 38 33  Alk Phosphatase 38 - 126 U/L 62 56 50  Total Bilirubin 0.3 - 1.2 mg/dL 0.6 0.8 0.9  Bilirubin, Direct 0.0 - 0.2 mg/dL - - -    2.  Essential hypertension.  Placed on Norvasc and hydralazine.  3.  COVID-19 gastroenteritis causing dehydration.  Gentle IV fluids for hydration.  4.  AKI versus CKD 4.  No previous creatinine on file.  Few rales, Lasix and monitor, was hydrated earlier upon admission.  5.  DM type II.  Poor outpatient control due to hyperglycemia Carma oral hypoglycemics on hold, with his underlying renal dysfunction will likely require insulin upon discharge, currently on Lantus, premeal NovoLog and ISS will monitor and adjust, steroids being tapered.  Lab Results  Component Value Date   HGBA1C 9.2 (H) 09/14/2019   CBG (last 3)  Recent Labs    09/16/19 1629 09/16/19 1933 09/17/19 0706  GLUCAP 228* 306* 160*      Condition - Extremely Guarded  Family Communication  :  None  Code Status :  Full  Diet :   Diet Order            Diet  Heart Room service appropriate? Yes; Fluid consistency: Thin  Diet effective now               Disposition Plan  :  TBD  Consults  :  None  Procedures  :     TTE -   1. Left ventricular ejection fraction, by visual estimation, is 60 to 65%. The left ventricle has normal function. There is moderately increased left ventricular hypertrophy.  2. Left ventricular diastolic parameters are consistent with Grade I diastolic dysfunction (impaired relaxation).  3. The left ventricle has no regional wall motion abnormalities.  4. Global right ventricle was not well visualized.The right ventricular size is not well visualized. Right vetricular wall thickness was not assessed.  5. Left atrial size was normal.  6. Right atrial size was not well visualized.  7. The mitral valve is grossly normal. No evidence of mitral valve regurgitation.  8. The tricuspid valve is not well visualized.  9. The aortic valve is tricuspid. Aortic valve regurgitation is not visualized. No evidence of aortic valve sclerosis or stenosis. 10. The pulmonic valve was grossly normal. Pulmonic valve regurgitation is not visualized. 11. Aortic dilatation noted. 12. There is mild dilatation of the ascending aorta. 13. The interatrial septum was not well visualized.  CTA - 1. No CT evidence of pulmonary embolism. 2. Multifocal pneumonia. Clinical correlation and follow-up to resolution recommended.  PUD Prophylaxis :  DVT Prophylaxis  :  Lovenox   Lab Results  Component Value Date   PLT 349 09/17/2019    Inpatient Medications  Scheduled Meds: . amLODipine  10 mg Oral Daily  . carvedilol  6.25 mg Oral BID WC  . dextromethorphan-guaiFENesin  1 tablet Oral BID  . diclofenac Sodium  2 g Topical QID  . enoxaparin (LOVENOX) injection  40 mg Subcutaneous Daily  . insulin aspart  0-15 Units Subcutaneous TID WC  . insulin aspart  0-5 Units Subcutaneous QHS  . insulin aspart  8 Units Subcutaneous TID WC  . insulin  glargine  50 Units Subcutaneous Daily  . methylPREDNISolone (SOLU-MEDROL) injection  40 mg Intravenous Q12H  . simvastatin  20 mg Oral QPM  . timolol  1 drop Both Eyes BID  . traMADol  50 mg Oral TID   Continuous Infusions: . remdesivir 100 mg in NS 100 mL 100 mg (09/17/19 0845)   PRN Meds:.acetaminophen, guaiFENesin-dextromethorphan, loperamide, ondansetron (ZOFRAN) IV  Antibiotics  :    Anti-infectives (From admission, onward)   Start     Dose/Rate Route Frequency Ordered Stop   09/15/19 1000  remdesivir 100 mg in sodium chloride 0.9 % 100 mL IVPB     100 mg 200 mL/hr over 30 Minutes Intravenous Daily 09/14/19 0150 09/19/19 0959   09/14/19 0230  remdesivir 200 mg in sodium chloride 0.9% 250 mL IVPB     200 mg 580 mL/hr over 30 Minutes Intravenous Once 09/14/19 0150 09/14/19 0456       Time Spent in minutes  30   Lala Lund M.D on 09/17/2019 at 9:38 AM  To page go to [email or web address] - password [facility]  [facility] -  Office  [phone]    See all Orders from today for further details    Objective:   Vitals:   09/17/19 0400 09/17/19 0407 09/17/19 0500 09/17/19 0840  BP:  130/85  123/88  Pulse:  70 65 82  Resp: (!) 25 20 (!) 24 20  Temp:    98.1 F (36.7 C)  TempSrc:    Oral  SpO2:  (!) 89% 94% 96%  Weight:      Height:        Wt Readings from Last 3 Encounters:  09/14/19 94.2 kg     Intake/Output Summary (Last 24 hours) at 09/17/2019 0938 Last data filed at 09/17/2019 0409 Gross per 24 hour  Intake 340 ml  Output 2500 ml  Net -2160 ml     Physical Exam  Awake Alert,  No new F.N deficits, Normal affect [place].AT,PERRAL Supple Neck,No JVD, No cervical lymphadenopathy appriciated.  Symmetrical Chest wall movement, Good air movement bilaterally, few rales RRR,No Gallops, Rubs or new Murmurs, No Parasternal Heave +ve B.Sounds, Abd Soft, No tenderness, No organomegaly appriciated, No rebound - guarding or rigidity. No Cyanosis, Clubbing or  edema, No new Rash or bruise    Data Review:    CBC Recent Labs  Lab 09/13/19 1804 09/14/19 0408 09/15/19 0132 09/16/19 1015 09/17/19 0224  WBC 8.2 9.8 7.7 16.5* 17.2*  HGB 14.0 13.6 12.9* 13.7 13.8  HCT 41.5 40.5 39.1 40.9 41.1  PLT 203 215 247 319 349  MCV 82.2 83.3 81.8 82.5 81.4  MCH 27.7 28.0 27.0 27.6 27.3  MCHC 33.7 33.6 33.0 33.5 33.6  RDW 14.6 14.6 14.5 14.6 14.6  LYMPHSABS  --   --  0.5* 0.6* 0.8  MONOABS  --   --  0.5 1.0 1.3*  EOSABS  --   --  0.0 0.0 0.0  BASOSABS  --   --  0.0 0.0 0.0    Chemistries  Recent Labs  Lab 09/13/19 1804 09/13/19 1850 09/14/19 0408 09/15/19 0132 09/16/19 1015 09/17/19 0224  NA 135  --  139 137 139 139  K 3.5  --  3.7 3.5 3.6 3.8  CL 96*  --  97* 99 101 100  CO2 27  --  '30 28 26 27  ' GLUCOSE 348*  --  297* 145* 212* 218*  BUN 19  --  23* 40* 42* 41*  CREATININE 1.86*  --  1.90* 1.85* 1.60* 1.51*  CALCIUM 7.7*  --  7.8* 7.5* 7.8* 7.9*  MG  --   --   --  2.1 2.3 2.4  AST  --  49*  --  34 35 27  ALT  --  36  --  33 38 39  ALKPHOS  --  52  --  50 56 62  BILITOT  --  0.8  --  0.9 0.8 0.6   ------------------------------------------------------------------------------------------------------------------ No results for input(s): CHOL, HDL, LDLCALC, TRIG, CHOLHDL, LDLDIRECT in the last 72 hours.  Lab Results  Component Value Date   HGBA1C 9.2 (H) 09/14/2019   ------------------------------------------------------------------------------------------------------------------ No results for input(s): TSH, T4TOTAL, T3FREE, THYROIDAB in the last 72 hours.  Invalid input(s): FREET3  Cardiac Enzymes No results for input(s): CKMB, TROPONINI, MYOGLOBIN in the last 168 hours.  Invalid input(s): CK ------------------------------------------------------------------------------------------------------------------    Component Value Date/Time   BNP 87.9 09/17/2019 0224    Micro Results Recent Results (from the past 240 hour(s))   Novel Coronavirus, NAA ([facility])     Status: Abnormal   Collection Time: 09/13/19 12:00 AM   Specimen: Nasopharyngeal(NP) swabs in vial transport medium   NASOPHARYNGE  TESTING  Result Value Ref Range Status   SARS-CoV-2, NAA Detected (A) Not Detected Final    Comment: This nucleic acid amplification test was developed and its performance characteristics determined by [employer]. Nucleic acid amplification tests include PCR and TMA. This test has not been FDA cleared or approved. This test has been authorized by FDA under an Emergency Use Authorization (EUA). This test is only authorized for the duration of time the declaration that circumstances exist justifying the authorization of the emergency use of in vitro diagnostic tests for detection of SARS-CoV-2 virus and/or diagnosis of COVID-19 infection under section 564(b)(1) of the Act, 21 U.S.C. 498YME-1(R) (1), unless the authorization is terminated or revoked sooner. When diagnostic testing is negative, the possibility of a false negative result should be considered in the context of a patient's recent exposures and the presence of clinical signs and symptoms consistent with COVID-19. An individual without symptoms of COVID-19 and who is not shedding SARS-CoV-2 virus would  expect to have a negative (not detected) result in this assay.   SARS CORONAVIRUS 2 (TAT 6-24 HRS) Nasopharyngeal Nasopharyngeal Swab     Status: Abnormal   Collection Time: 09/13/19  7:04 PM   Specimen: Nasopharyngeal Swab  Result Value Ref Range Status   SARS Coronavirus 2 POSITIVE (A) NEGATIVE Final    Comment: RESULT CALLED TO, READ BACK BY AND VERIFIED WITH: Neldon Labella 0131 09/14/2019 D BRADLEY (NOTE) SARS-CoV-2 target nucleic acids are DETECTED. The SARS-CoV-2 RNA is generally detectable in upper and lower respiratory specimens during the acute phase of infection. Positive results are indicative of the presence of SARS-CoV-2 RNA.  Clinical correlation with patient history and other diagnostic information is  necessary to determine patient infection  status. Positive results do not rule out bacterial infection or co-infection with other viruses.  The expected result is Negative. Fact Sheet for Patients: SugarRoll.be Fact Sheet for Healthcare Providers: https://www.woods-mathews.com/ This test is not yet approved or cleared by the [place] FDA and  has been authorized for detection and/or diagnosis of SARS-CoV-2 by FDA under an Emergency Use Authorization (EUA). This EUA will remain  in effect (meaning this test can be used) for t he duration of the COVID-19 declaration under Section 564(b)(1) of the Act, 21 U.S.C. section 360bbb-3(b)(1), unless the authorization is terminated or revoked sooner. Performed at [facility], [place] [address] [place], [place] [address]   Respiratory Panel by PCR     Status: None   Collection Time: 09/14/19  4:08 AM   Specimen: Nasopharyngeal Swab; Respiratory  Result Value Ref Range Status   Adenovirus NOT DETECTED NOT DETECTED Final   Coronavirus 229E NOT DETECTED NOT DETECTED Final    Comment: (NOTE) The Coronavirus on the Respiratory Panel, DOES NOT test for the novel  Coronavirus (2019 nCoV)    Coronavirus HKU1 NOT DETECTED NOT DETECTED Final   Coronavirus NL63 NOT DETECTED NOT DETECTED Final   Coronavirus OC43 NOT DETECTED NOT DETECTED Final   Metapneumovirus NOT DETECTED NOT DETECTED Final   Rhinovirus / Enterovirus NOT DETECTED NOT DETECTED Final   Influenza A NOT DETECTED NOT DETECTED Final   Influenza B NOT DETECTED NOT DETECTED Final   Parainfluenza Virus 1 NOT DETECTED NOT DETECTED Final   Parainfluenza Virus 2 NOT DETECTED NOT DETECTED Final   Parainfluenza Virus 3 NOT DETECTED NOT DETECTED Final   Parainfluenza Virus 4 NOT DETECTED NOT DETECTED Final   Respiratory Syncytial Virus NOT DETECTED NOT DETECTED Final    Bordetella pertussis NOT DETECTED NOT DETECTED Final   Chlamydophila pneumoniae NOT DETECTED NOT DETECTED Final   Mycoplasma pneumoniae NOT DETECTED NOT DETECTED Final    Comment: Performed at [facility], [address], [place], [place] [address]    Radiology Reports CT Angio Chest PE W and/or Wo Contrast  Result Date: 09/13/2019 CLINICAL DATA:  53 year old male with chest pain. Concern for pulmonary embolism. EXAM: CT ANGIOGRAPHY CHEST WITH CONTRAST TECHNIQUE: Multidetector CT imaging of the chest was performed using the standard protocol during bolus administration of intravenous contrast. Multiplanar CT image reconstructions and MIPs were obtained to evaluate the vascular anatomy. CONTRAST:  87m OMNIPAQUE IOHEXOL 350 MG/ML SOLN COMPARISON:  Chest radiograph dated 09/13/2019. FINDINGS: Cardiovascular: Top-normal cardiac size. No pericardial effusion. The thoracic aorta is unremarkable. No pulmonary artery embolism identified. Mediastinum/Nodes: No hilar or mediastinal adenopathy. The esophagus and the thyroid gland are grossly unremarkable. No mediastinal fluid collection or hematoma. Lungs/Pleura: Bilateral patchy airspace opacities most consistent with multifocal pneumonia, likely atypical or viral etiology. Clinical correlation and follow-up to resolution recommended. There is no pleural effusion or pneumothorax. The central airways are patent. Upper Abdomen: No acute abnormality. Musculoskeletal: No chest wall abnormality. No acute or significant osseous findings. Review of the MIP images confirms the above findings. IMPRESSION: 1. No CT evidence of pulmonary embolism. 2. Multifocal pneumonia. Clinical correlation and follow-up to resolution recommended. Electronically Signed   By: AAnner CreteM.D.   On: 09/13/2019 22:51   DG Chest Port 1 View  Result Date: 09/16/2019 CLINICAL DATA:  Shortness of breath and COVID-19 pneumonia. EXAM: PORTABLE CHEST 1 VIEW COMPARISON:  09/13/2019  FINDINGS: The heart size and mediastinal contours are within normal limits. Lung volumes are low bilaterally.  Bilateral pulmonary infiltrates predominantly in a lower lung zone distribution appear fairly stable without significant progression since the prior chest x-ray. Upper lung zones remain relatively well aerated. No overt pulmonary edema, pneumothorax or pleural fluid identified. The visualized skeletal structures are unremarkable. IMPRESSION: No significant change in bilateral pulmonary infiltrates since the prior chest x-ray. Electronically Signed   By: Aletta Edouard M.D.   On: 09/16/2019 09:29   DG Chest Portable 1 View  Result Date: 09/13/2019 CLINICAL DATA:  Shortness of breath, diabetes mellitus, hypertension EXAM: PORTABLE CHEST 1 VIEW COMPARISON:  Portable exam 1818 hours without priors for comparison FINDINGS: Upper normal heart size. Normal mediastinal contours and pulmonary vascularity. Patchy infiltrates at the inferior lungs bilaterally greater on LEFT question multifocal pneumonia, less likely atelectasis. Upper lungs clear. No pleural effusion or pneumothorax. IMPRESSION: Bibasilar opacities RIGHT greater than LEFT favor multifocal pneumonia over atelectasis. Electronically Signed   By: Lavonia Dana M.D.   On: 09/13/2019 19:09   ECHOCARDIOGRAM COMPLETE  Result Date: 09/16/2019   ECHOCARDIOGRAM REPORT   Patient Name:   NOOR VIDALES Date of Exam: 09/16/2019 Medical Rec #:  [id number]        Height:       73.0 in Accession #:    [id number]       Weight:       207.7 lb Date of Birth:  14-Oct-1965        BSA:          2.19 m Patient Age:    53 years         BP:           135/88 mmHg Patient Gender: M                HR:           102 bpm. Exam Location:  Inpatient Procedure: 2D Echo Indications:    Chest Pain 786.50 / R07.9  History:        Patient has no prior history of Echocardiogram examinations.                 Risk Factors:Hypertension and Diabetes. Covid 19. Acute Hypoxic                  Resp. Failure.  Sonographer:    Darlina Sicilian RDCS Referring Phys: [id number] Margaree Mackintosh [facility]  Sonographer Comments: [place]  1. Left ventricular ejection fraction, by visual estimation, is 60 to 65%. The left ventricle has normal function. There is moderately increased left ventricular hypertrophy.  2. Left ventricular diastolic parameters are consistent with Grade I diastolic dysfunction (impaired relaxation).  3. The left ventricle has no regional wall motion abnormalities.  4. Global right ventricle was not well visualized.The right ventricular size is not well visualized. Right vetricular wall thickness was not assessed.  5. Left atrial size was normal.  6. Right atrial size was not well visualized.  7. The mitral valve is grossly normal. No evidence of mitral valve regurgitation.  8. The tricuspid valve is not well visualized.  9. The aortic valve is tricuspid. Aortic valve regurgitation is not visualized. No evidence of aortic valve sclerosis or stenosis. 10. The pulmonic valve was grossly normal. Pulmonic valve regurgitation is not visualized. 11. Aortic dilatation noted. 12. There is mild dilatation of the ascending aorta. 13. The interatrial septum was not well visualized. FINDINGS  Left Ventricle: Left ventricular ejection fraction, by visual estimation, is 60 to 65%. The left ventricle has normal  function. The left ventricle has no regional wall motion abnormalities. There is moderately increased left ventricular hypertrophy. Concentric left ventricular hypertrophy. Left ventricular diastolic parameters are consistent with Grade I diastolic dysfunction (impaired relaxation). Normal left atrial pressure. Right Ventricle: The right ventricular size is not well visualized. Right vetricular wall thickness was not assessed. Global RV systolic function is was not well visualized. Left Atrium: Left atrial size was normal in size. Right Atrium: Right atrial size was not well visualized  Pericardium: There is no evidence of pericardial effusion. Mitral Valve: The mitral valve is grossly normal. No evidence of mitral valve regurgitation. Tricuspid Valve: The tricuspid valve is not well visualized. Tricuspid valve regurgitation is not demonstrated. Aortic Valve: The aortic valve is tricuspid. Aortic valve regurgitation is not visualized. The aortic valve is structurally normal, with no evidence of sclerosis or stenosis. Pulmonic Valve: The pulmonic valve was grossly normal. Pulmonic valve regurgitation is not visualized. Pulmonic regurgitation is not visualized. Aorta: The aortic root is normal in size and structure and aortic dilatation noted. There is mild dilatation of the ascending aorta. Venous: The inferior vena cava was not well visualized. IAS/Shunts: The interatrial septum was not well visualized.  LEFT VENTRICLE PLAX 2D LVIDd:         3.58 cm  Diastology LVIDs:         2.28 cm  LV e' lateral:   5.11 cm/s LV PW:         1.44 cm  LV E/e' lateral: 9.8 LV IVS:        1.11 cm  LV e' medial:    5.77 cm/s LVOT diam:     2.10 cm  LV E/e' medial:  8.7 LV SV:         36 ml LV SV Index:   16.23 LVOT Area:     3.46 cm  LEFT ATRIUM           Index LA diam:      3.10 cm 1.42 cm/m LA Vol (A4C): 33.3 ml 15.23 ml/m   AORTA Ao Root diam: 3.40 cm Ao Asc diam:  3.70 cm MITRAL VALVE MV Area (PHT): 3.60 cm             SHUNTS MV PHT:        61.19 msec           Systemic Diam: 2.10 cm MV Decel Time: 211 msec MV E velocity: 50.00 cm/s 103 cm/s MV A velocity: 70.30 cm/s 70.3 cm/s MV E/A ratio:  0.71       1.5  Kate Sable MD Electronically signed by Kate Sable MD Signature Date/Time: 09/16/2019/9:38:42 AM    Final

## 2019-09-17 NOTE — Progress Notes (Signed)
Inpatient Diabetes Program Recommendations  AACE/ADA: New Consensus Statement on Inpatient Glycemic Control (2015)  Target Ranges:  Prepandial:   less than 140 mg/dL      Peak postprandial:   less than 180 mg/dL (1-2 hours)      Critically ill patients:  140 - 180 mg/dL   Lab Results  Component Value Date   GLUCAP 397 (H) 09/17/2019   HGBA1C 9.2 (H) 09/14/2019    Review of Glycemic Control Results for Taylor Lynch, Taylor Lynch (MRN [id number]) as of 09/17/2019 15:20  Ref. Range 09/16/2019 07:27 09/16/2019 11:46 09/16/2019 16:29 09/16/2019 19:33 09/17/2019 07:06 09/17/2019 13:30  Glucose-Capillary Latest Ref Range: 70 - 99 mg/dL 114 (H) 222 (H) 228 (H) 306 (H) 160 (H) 397 (H)   Diabetes history: DM 2 Outpatient Diabetes medications:  Metformin 1000 mg bid,  Glucotrol XL 10 mg daily Current orders for Inpatient glycemic control:  Novolog moderate tid with meals and HS Novolog 8 units tid with meals Lantus 50 units daily Solumedrol 40 mg IV q 12 hours Inpatient Diabetes Program Recommendations:    Referral received.  Noted that patient may need insulin at discharge.  Attempted to call patient in his hospital room however there was no answer.  Alerted RN that I would order Living well with DM booklet and insulin starter kit just in case patient needs insulin at home. Will also order patient education by bedside RN for insulin administration teaching.  Will follow.   Thanks,  Adah Perl, RN, BC-ADM Inpatient Diabetes Coordinator Pager [phone] (8a-5p)

## 2019-09-17 NOTE — Evaluation (Addendum)
Occupational Therapy Evaluation Patient Details Name: Taylor Lynch MRN: [id number] DOB: [birth year] Today's Date: 09/17/2019    History of Present Illness 54 y.o. male with medical history significant of hypertension and type II diabetes who presented with increasing shortness of breath, further work-up suggested acute hypoxic respiratory failure due to COVID-19 pneumonia, he initially required 4 L nasal cannula oxygen was started on IV steroids and remdesivir but continued to get more hypoxic was eventually sent to [facility] on 7 L nasal cannula oxygen.   Clinical Impression   This 53 y/o male presents with the above. PTA pt reports independence with ADL, iADL and functional mobility. Pt currently requiring minguard-minA for functional mobility using RW; requiring at least minA for LB ADL. Pt completing x2 laps from recliner to/from door during session. Pt with reports of increased DOE with second lap with cues provided for deep breathing techniques and seated rest provided. Pt on 2L O2 during mobility, difficult to maintain good pleth reading for duration of mobility but intermittently able to obtain and overall sats >90%. Once returned to sitting  pt with increased DOE, unable to obtain accurate O2 reading (trying finger and earlobe probe), increased O2 to 4L to ensure adequate O2 needs until able to get clear reading. Within approx 3 min able to maintain O2 sats and reading 91%, though question whether pt may have desatted some prior to seated rest. Able to titrate O2 back to 2L end of session with SpO2 >95%. Pt denies dizziness with mobility this session. He will benefit from continued acute OT services and recommend follow up [facility] services (pending progress) to maximize his safety and independence with ADL and mobility. Will follow.     Follow Up Recommendations  Home health OT;Supervision/Assistance - 24 hour    Equipment Recommendations  Tub/shower seat(may progress to none)            Precautions / Restrictions Precautions Precautions: Fall Precaution Comments: monitor sats, some dizziness, difficult to pick up on ear and finger  Restrictions Weight Bearing Restrictions: No      Mobility Bed Mobility               General bed mobility comments: in recliner  Transfers Overall transfer level: Needs assistance Equipment used: Rolling walker (2 wheeled) Transfers: Sit to/from Stand Sit to Stand: Min guard         General transfer comment: cues for hand placement    Balance Overall balance assessment: Needs assistance Sitting-balance support: Feet supported Sitting balance-Leahy Scale: Good     Standing balance support: Bilateral upper extremity supported Standing balance-Leahy Scale: Fair Standing balance comment: use of UE support for increased stability this session                           ADL either performed or assessed with clinical judgement   ADL Overall ADL's : Needs assistance/impaired Eating/Feeding: Modified independent;Sitting   Grooming: Set up;Sitting   Upper Body Bathing: Min guard;Sitting   Lower Body Bathing: Minimal assistance;Sit to/from stand   Upper Body Dressing : Set up;Sitting   Lower Body Dressing: Minimal assistance;Sit to/from stand   Toilet Transfer: Minimal assistance;Ambulation;RW Toilet Transfer Details (indicate cue type and reason): simulated via transfer to/from recliner Toileting- Clothing Manipulation and Hygiene: Minimal assistance;Sit to/from stand Toileting - Clothing Manipulation Details (indicate cue type and reason): pt has been using urinal ad lib      Functional mobility during ADLs: Minimal assistance;Rolling walker General ADL  Comments: pt with weakness, decreased standing balance, poor activity tolerance and respiratory status     Vision         Perception     Praxis      Pertinent Vitals/Pain Pain Assessment: No/denies pain     Hand Dominance      Extremity/Trunk Assessment Upper Extremity Assessment Upper Extremity Assessment: Generalized weakness   Lower Extremity Assessment Lower Extremity Assessment: Defer to PT evaluation   Cervical / Trunk Assessment Cervical / Trunk Assessment: Normal   Communication Communication Communication: Other (comment)(some difficulty understanding therapist at times)   Cognition Arousal/Alertness: Awake/alert Behavior During Therapy: WFL for tasks assessed/performed Overall Cognitive Status: Within Functional Limits for tasks assessed                                     General Comments       Exercises     Shoulder Instructions      Home Living Family/patient expects to be discharged to:: Private residence Living Arrangements: Spouse/significant other Available Help at Discharge: Family Type of Home: House       Home Layout: One level     Bathroom Shower/Tub: Teacher, early years/pre: Standard     Home Equipment: None          Prior Functioning/Environment Level of Independence: Independent        Comments: works in [employer], clinical Problem List: Decreased strength;Decreased activity tolerance;Impaired balance (sitting and/or standing);Decreased knowledge of use of DME or AE;Cardiopulmonary status limiting activity;Decreased knowledge of precautions      OT Treatment/Interventions: Self-care/ADL training;Therapeutic exercise;Energy conservation;DME and/or AE instruction;Therapeutic activities;Patient/family education;Balance training    OT Goals(Current goals can be found in the care plan section) Acute Rehab OT Goals Patient Stated Goal: to go home OT Goal Formulation: With patient Time For Goal Achievement: 10/01/19 Potential to Achieve Goals: Good  OT Frequency: Min 2X/week   Barriers to D/C:            Co-evaluation              AM-PAC OT "6 Clicks" Daily Activity     Outcome Measure Help from another  person eating meals?: None Help from another person taking care of personal grooming?: A Little Help from another person toileting, which includes using toliet, bedpan, or urinal?: A Little Help from another person bathing (including washing, rinsing, drying)?: A Little Help from another person to put on and taking off regular upper body clothing?: None Help from another person to put on and taking off regular lower body clothing?: A Little 6 Click Score: 20   End of Session Equipment Utilized During Treatment: Rolling walker;Oxygen Nurse Communication: Mobility status  Activity Tolerance: Patient tolerated treatment well Patient left: in chair;with call bell/phone within reach  OT Visit Diagnosis: Muscle weakness (generalized) (M62.81);Unsteadiness on feet (R26.81)                Time: [id number] OT Time Calculation (min): 38 min Charges:  OT General Charges $OT Visit: 1 Visit OT Evaluation $OT Eval Moderate Complexity: 1 Mod OT Treatments $Self Care/Home Management : 8-22 mins  Lou Cal, OT Supplemental Rehabilitation Services Pager [phone] Office [phone]   Raymondo Band 09/17/2019, 12:54 PM

## 2019-09-17 NOTE — Progress Notes (Signed)
Pt was weaned down to 2.5L/ Pt states overall he feels much better than yesterday. Spoke w/ pt about diabetes management & BGC, insulin. Pt didn't have any questions/gave very short responses. However, I sense he needs continuous education until d/c. I did ask about any financial barriers regarding his insulin & he said "none".  Pt had good OP after 40 mg IV lasix Pt VSS today SQ lovenox given Pt sat up in chair for 4 hours today  IS used today.

## 2019-09-18 LAB — COMPREHENSIVE METABOLIC PANEL
ALT: 44 U/L (ref 0–44)
AST: 31 U/L (ref 15–41)
Albumin: 2.6 g/dL — ABNORMAL LOW (ref 3.5–5.0)
Alkaline Phosphatase: 69 U/L (ref 38–126)
Anion gap: 13 (ref 5–15)
BUN: 38 mg/dL — ABNORMAL HIGH (ref 6–20)
CO2: 25 mmol/L (ref 22–32)
Calcium: 7.9 mg/dL — ABNORMAL LOW (ref 8.9–10.3)
Chloride: 101 mmol/L (ref 98–111)
Creatinine, Ser: 1.47 mg/dL — ABNORMAL HIGH (ref 0.61–1.24)
GFR calc Af Amer: >60 mL/min (ref >60)
GFR calc non Af Amer: 53 mL/min — ABNORMAL LOW (ref >60)
Glucose, Bld: 181 mg/dL — ABNORMAL HIGH (ref 70–99)
Potassium: 3.8 mmol/L (ref 3.5–5.1)
Sodium: 139 mmol/L (ref 135–145)
Total Bilirubin: 0.5 mg/dL (ref 0.3–1.2)
Total Protein: 6.4 g/dL — ABNORMAL LOW (ref 6.5–8.1)

## 2019-09-18 LAB — CBC WITH DIFFERENTIAL/PLATELET
Abs Immature Granulocytes: 0.73 10*3/uL — ABNORMAL HIGH (ref 0.00–0.07)
Basophils Absolute: 0.1 10*3/uL (ref 0.0–0.1)
Basophils Relative: 0 %
Eosinophils Absolute: 0 10*3/uL (ref 0.0–0.5)
Eosinophils Relative: 0 %
HCT: 42.6 % (ref 39.0–52.0)
Hemoglobin: 14.2 g/dL (ref 13.0–17.0)
Immature Granulocytes: 4 %
Lymphocytes Relative: 5 %
Lymphs Abs: 1.1 10*3/uL (ref 0.7–4.0)
MCH: 27.5 pg (ref 26.0–34.0)
MCHC: 33.3 g/dL (ref 30.0–36.0)
MCV: 82.4 fL (ref 80.0–100.0)
Monocytes Absolute: 1.5 10*3/uL — ABNORMAL HIGH (ref 0.1–1.0)
Monocytes Relative: 7 %
Neutro Abs: 16.9 10*3/uL — ABNORMAL HIGH (ref 1.7–7.7)
Neutrophils Relative %: 84 %
Platelets: 371 10*3/uL (ref 150–400)
RBC: 5.17 MIL/uL (ref 4.22–5.81)
RDW: 14.6 % (ref 11.5–15.5)
WBC: 20.3 10*3/uL — ABNORMAL HIGH (ref 4.0–10.5)
nRBC: 1.4 % — ABNORMAL HIGH (ref 0.0–0.2)

## 2019-09-18 LAB — GLUCOSE, CAPILLARY
Glucose-Capillary: 146 mg/dL — ABNORMAL HIGH (ref 70–99)
Glucose-Capillary: 264 mg/dL — ABNORMAL HIGH (ref 70–99)
Glucose-Capillary: 321 mg/dL — ABNORMAL HIGH (ref 70–99)
Glucose-Capillary: 352 mg/dL — ABNORMAL HIGH (ref 70–99)

## 2019-09-18 LAB — D-DIMER, QUANTITATIVE: D-Dimer, Quant: 1.03 ug/mL-FEU — ABNORMAL HIGH (ref 0.00–0.50)

## 2019-09-18 LAB — MAGNESIUM: Magnesium: 2.3 mg/dL (ref 1.7–2.4)

## 2019-09-18 LAB — PROCALCITONIN: Procalcitonin: 0.11 ng/mL

## 2019-09-18 LAB — C-REACTIVE PROTEIN: CRP: 0.9 mg/dL (ref <1.0)

## 2019-09-18 LAB — BRAIN NATRIURETIC PEPTIDE: B Natriuretic Peptide: 56 pg/mL (ref 0.0–100.0)

## 2019-09-18 MED ORDER — FUROSEMIDE 10 MG/ML IJ SOLN
40.0000 mg | Freq: Once | INTRAMUSCULAR | Status: AC
  Administered 2019-09-18: 40 mg via INTRAVENOUS
  Filled 2019-09-18: qty 4

## 2019-09-18 MED ORDER — METHYLPREDNISOLONE SODIUM SUCC 40 MG IJ SOLR
40.0000 mg | Freq: Every day | INTRAMUSCULAR | Status: DC
  Administered 2019-09-18 – 2019-09-19 (×2): 40 mg via INTRAVENOUS
  Filled 2019-09-18: qty 1

## 2019-09-18 NOTE — Plan of Care (Signed)
  Problem: Education: Goal: Knowledge of risk factors and measures for prevention of condition will improve Outcome: Progressing   Problem: Respiratory: Goal: Will maintain a patent airway Outcome: Progressing Goal: Complications related to the disease process, condition or treatment will be avoided or minimized Outcome: Progressing   

## 2019-09-18 NOTE — Plan of Care (Signed)
  Problem: Education: Goal: Knowledge of risk factors and measures for prevention of condition will improve Outcome: Progressing   Problem: Coping: Goal: Psychosocial and spiritual needs will be supported Outcome: Progressing   Problem: Respiratory: Goal: Will maintain a patent airway Outcome: Progressing Goal: Complications related to the disease process, condition or treatment will be avoided or minimized Outcome: Progressing   

## 2019-09-18 NOTE — Progress Notes (Signed)
PROGRESS NOTE                                                                                                                                                                                                             Patient Demographics:    Taylor Lynch, is a 53 y.o. male, DOB - February 23, 1966, PVX:[id number]  Outpatient Primary MD for the patient is Patient, No Pcp Per    LOS - 4  Admit date - 09/13/2019    Chief complaint.  Shortness of breath     Brief Narrative  53 y.o.malewith medical history significant ofhypertension and type II diabetes who presented with increasing shortness of breath, further work-up suggested acute hypoxic respiratory failure due to COVID-19 pneumonia, he initially required 4 L nasal cannula oxygen was started on IV steroids and remdesivir but continued to get more hypoxic was eventually sent to [facility] on 7 L nasal cannula oxygen.   Subjective:   Patient in bed, appears comfortable, denies any headache, no fever, no chest pain or pressure, no shortness of breath , no abdominal pain. No focal weakness.   Assessment  & Plan :     1. Acute Hypoxic Resp. Failure due to Acute Covid 19 Viral Pneumonitis during the ongoing 2020 Covid 19 Pandemic - he had severe disease, currently requiring 7 L of oxygen, was unable to talk in full sentences and was working hard for breathing, CRP was greater than 20, CTA had ruled out PE, he was already on steroids and IV remdesivir which were continued, he was given a dose of Actemra on 09/14/2019 after appropriate consent, within 12 hours he has shown remarkable improvement he is down to RA at rest and 2 L nasal cannula oxygen upon activity and feeling better, continue tapering steroids.  Note patient has pleuritic chest pain which is preventing him from taking deep breaths and causing falls hypoxia, applied BenGay cream and gave him 1 dose of tramadol after which his  oxygen requirement has gone down from 15 L to 2 L.  Nursing staff educated.  D-dimer and CTA stable, stable echocardiogram & -ve Trop.   Encouraged the patient to sit up in chair in the daytime use I-S and flutter valve for pulmonary toiletry and then prone in bed when at night.   SpO2: 100 % O2 Flow Rate (L/min): 2 L/min  Recent Labs  Lab 09/13/19 0000 09/13/19 1904 09/14/19 1122 09/14/19 1323 09/15/19 0132 09/16/19 1015 09/17/19 0224 09/18/19 0152  CRP  --   --  20.7*  --  15.6* 3.7* 1.9* 0.9  DDIMER  --   --   --  0.82* 0.88* 2.98* 1.39* 1.03*  BNP  --   --   --  35.8 40.9 65.2 87.9 56.0  PROCALCITON  --   --   --  0.16 0.17 0.15  --   --   SARSCOV2NAA Detected* POSITIVE*  --   --   --   --   --   --     Hepatic Function Latest Ref Rng & Units 09/17/2019 09/16/2019 09/15/2019  Total Protein 6.5 - 8.1 g/dL 6.3(L) 6.8 6.6  Albumin 3.5 - 5.0 g/dL 2.4(L) 2.6(L) 2.5(L)  AST 15 - 41 U/L 27 35 34  ALT 0 - 44 U/L 39 38 33  Alk Phosphatase 38 - 126 U/L 62 56 50  Total Bilirubin 0.3 - 1.2 mg/dL 0.6 0.8 0.9  Bilirubin, Direct 0.0 - 0.2 mg/dL - - -    2.  Essential hypertension.  Placed on Norvasc and hydralazine combination with good control.  3.  COVID-19 gastroenteritis causing dehydration.  Gentle IV fluids for hydration.  4.  AKI versus CKD 4.  No previous creatinine on file.  Proving with gentle diuresis, continue IV Lasix today.  5.  DM type II.  Poor outpatient control due to hyperglycemia Carma oral hypoglycemics on hold, with his underlying renal dysfunction will likely require insulin upon discharge, currently on Lantus, premeal NovoLog and ISS will monitor and adjust, steroids being tapered.  Lab Results  Component Value Date   HGBA1C 9.2 (H) 09/14/2019   CBG (last 3)  Recent Labs    09/17/19 1626 09/17/19 2055 09/18/19 0834  GLUCAP 284* 333* 146*      Condition - Extremely Guarded  Family Communication  :  None  Code Status :  Full  Diet :   Diet  Order            Diet Heart Room service appropriate? Yes; Fluid consistency: Thin  Diet effective now               Disposition Plan  :  TBD  Consults  :  None  Procedures  :     TTE -   1. Left ventricular ejection fraction, by visual estimation, is 60 to 65%. The left ventricle has normal function. There is moderately increased left ventricular hypertrophy.  2. Left ventricular diastolic parameters are consistent with Grade I diastolic dysfunction (impaired relaxation).  3. The left ventricle has no regional wall motion abnormalities.  4. Global right ventricle was not well visualized.The right ventricular size is not well visualized. Right vetricular wall thickness was not assessed.  5. Left atrial size was normal.  6. Right atrial size was not well visualized.  7. The mitral valve is grossly normal. No evidence of mitral valve regurgitation.  8. The tricuspid valve is not well visualized.  9. The aortic valve is tricuspid. Aortic valve regurgitation is not visualized. No evidence of aortic valve sclerosis or stenosis. 10. The pulmonic valve was grossly normal. Pulmonic valve regurgitation is not visualized. 11. Aortic dilatation noted. 12. There is mild dilatation of the ascending aorta. 13. The interatrial septum was not well visualized.  CTA - 1. No CT evidence of pulmonary embolism. 2. Multifocal pneumonia. Clinical correlation and follow-up to resolution recommended.  PUD Prophylaxis :    DVT Prophylaxis  :  Lovenox   Lab Results  Component Value Date   PLT 371 09/18/2019    Inpatient Medications  Scheduled Meds: . amLODipine  10 mg Oral Daily  . carvedilol  6.25 mg Oral BID WC  . dextromethorphan-guaiFENesin  1 tablet Oral BID  . diclofenac Sodium  2 g Topical QID  . enoxaparin (LOVENOX) injection  40 mg Subcutaneous Daily  . insulin aspart  0-15 Units Subcutaneous TID WC  . insulin aspart  0-5 Units Subcutaneous QHS  . insulin aspart  8 Units  Subcutaneous TID WC  . insulin glargine  50 Units Subcutaneous Daily  . methylPREDNISolone (SOLU-MEDROL) injection  40 mg Intravenous Q12H  . simvastatin  20 mg Oral QPM  . timolol  1 drop Both Eyes BID   Continuous Infusions: . remdesivir 100 mg in NS 100 mL 100 mg (09/17/19 0845)   PRN Meds:.acetaminophen, guaiFENesin-dextromethorphan, loperamide, ondansetron (ZOFRAN) IV  Antibiotics  :    Anti-infectives (From admission, onward)   Start     Dose/Rate Route Frequency Ordered Stop   09/15/19 1000  remdesivir 100 mg in sodium chloride 0.9 % 100 mL IVPB     100 mg 200 mL/hr over 30 Minutes Intravenous Daily 09/14/19 0150 09/19/19 0959   09/14/19 0230  remdesivir 200 mg in sodium chloride 0.9% 250 mL IVPB     200 mg 580 mL/hr over 30 Minutes Intravenous Once 09/14/19 0150 09/14/19 0456       Time Spent in minutes  30   Lala Lund M.D on 09/18/2019 at 9:14 AM  To page go to [email or web address] - password [facility]  [facility] -  Office  [phone]    See all Orders from today for further details    Objective:   Vitals:   09/17/19 1945 09/17/19 2356 09/18/19 0358 09/18/19 0757  BP: 120/79 113/87 (!) 147/89 138/88  Pulse: 81 87 64   Resp: _0 Temp: 97.7 F (36.5 C) 97.8 F (36.6 C) 97.7 F (36.5 C) 97.9 F (36.6 C)  TempSrc: Axillary Oral Oral Oral  SpO2: 99% 98% 100%   Weight:      Height:        Wt Readings from Last 3 Encounters:  09/14/19 94.2 kg     Intake/Output Summary (Last 24 hours) at 09/18/2019 0914 Last data filed at 09/17/2019 2200 Gross per 24 hour  Intake 1400 ml  Output 1850 ml  Net -450 ml     Physical Exam  Awake Alert,   No new F.N deficits, Normal affect [place].AT,PERRAL Supple Neck,No JVD, No cervical lymphadenopathy appriciated.  Symmetrical Chest wall movement, Good air movement bilaterally, CTAB RRR,No Gallops, Rubs or new Murmurs, No Parasternal Heave +ve B.Sounds, Abd Soft, No tenderness, No organomegaly  appriciated, No rebound - guarding or rigidity. No Cyanosis, Clubbing or edema, No new Rash or bruise    Data Review:    CBC Recent Labs  Lab 09/14/19 0408 09/15/19 0132 09/16/19 1015 09/17/19 0224 09/18/19 0152  WBC 9.8 7.7 16.5* 17.2* 20.3*  HGB 13.6 12.9* 13.7 13.8 14.2  HCT 40.5 39.1 40.9 41.1 42.6  PLT 215 247 319 349 371  MCV 83.3 81.8 82.5 81.4 82.4  MCH 28.0 27.0 27.6 27.3 27.5  MCHC 33.6 33.0 33.5 33.6 33.3  RDW 14.6 14.5 14.6 14.6 14.6  LYMPHSABS  --  0.5* 0.6* 0.8 1.1  MONOABS  --  0.5 1.0 1.3* 1.5*  EOSABS  --  0.0 0.0 0.0 0.0  BASOSABS  --  0.0 0.0 0.0 0.1    Chemistries  Recent Labs  Lab 09/13/19 1804 09/13/19 1850 09/14/19 0408 09/15/19 0132 09/16/19 1015 09/17/19 0224  NA 135  --  139 137 139 139  K 3.5  --  3.7 3.5 3.6 3.8  CL 96*  --  97* 99 101 100  CO2 27  --  _0 GLUCOSE 348*  --  297* 145* 212* 218*  BUN 19  --  23* 40* 42* 41*  CREATININE 1.86*  --  1.90* 1.85* 1.60* 1.51*  CALCIUM 7.7*  --  7.8* 7.5* 7.8* 7.9*  MG  --   --   --  2.1 2.3 2.4  AST  --  49*  --  34 35 27  ALT  --  36  --  33 38 39  ALKPHOS  --  52  --  50 56 62  BILITOT  --  0.8  --  0.9 0.8 0.6   ------------------------------------------------------------------------------------------------------------------ No results for input(s): CHOL, HDL, LDLCALC, TRIG, CHOLHDL, LDLDIRECT in the last 72 hours.  Lab Results  Component Value Date   HGBA1C 9.2 (H) 09/14/2019   ------------------------------------------------------------------------------------------------------------------ No results for input(s): TSH, T4TOTAL, T3FREE, THYROIDAB in the last 72 hours.  Invalid input(s): FREET3  Cardiac Enzymes No results for input(s): CKMB, TROPONINI, MYOGLOBIN in the last 168 hours.  Invalid input(s): CK ------------------------------------------------------------------------------------------------------------------    Component Value Date/Time   BNP 56.0  09/18/2019 0152    Micro Results Recent Results (from the past 240 hour(s))  Novel Coronavirus, NAA ([facility])     Status: Abnormal   Collection Time: 09/13/19 12:00 AM   Specimen: Nasopharyngeal(NP) swabs in vial transport medium   NASOPHARYNGE  TESTING  Result Value Ref Range Status   SARS-CoV-2, NAA Detected (A) Not Detected Final    Comment: This nucleic acid amplification test was developed and its performance characteristics determined by [employer]. Nucleic acid amplification tests include PCR and TMA. This test has not been FDA cleared or approved. This test has been authorized by FDA under an Emergency Use Authorization (EUA). This test is only authorized for the duration of time the declaration that circumstances exist justifying the authorization of the emergency use of in vitro diagnostic tests for detection of SARS-CoV-2 virus and/or diagnosis of COVID-19 infection under section 564(b)(1) of the Act, 21 U.S.C. 160FUX-3(A) (1), unless the authorization is terminated or revoked sooner. When diagnostic testing is negative, the possibility of a false negative result should be considered in the context of a patient's recent exposures and the presence of clinical signs and symptoms consistent with COVID-19. An individual without symptoms of COVID-19 and who is not shedding SARS-CoV-2 virus would  expect to have a negative (not detected) result in this assay.   SARS CORONAVIRUS 2 (TAT 6-24 HRS) Nasopharyngeal Nasopharyngeal Swab     Status: Abnormal   Collection Time: 09/13/19  7:04 PM   Specimen: Nasopharyngeal Swab  Result Value Ref Range Status   SARS Coronavirus 2 POSITIVE (A) NEGATIVE Final    Comment: RESULT CALLED TO, READ BACK BY AND VERIFIED WITH: Neldon Labella 0131 09/14/2019 D BRADLEY (NOTE) SARS-CoV-2 target nucleic acids are DETECTED. The SARS-CoV-2 RNA is generally detectable in upper and lower respiratory specimens during the acute phase of  infection. Positive results are indicative of the presence of SARS-CoV-2 RNA. Clinical correlation with patient history and other diagnostic information is  necessary to determine patient infection status.  Positive results do not rule out bacterial infection or co-infection with other viruses.  The expected result is Negative. Fact Sheet for Patients: SugarRoll.be Fact Sheet for Healthcare Providers: https://www.woods-mathews.com/ This test is not yet approved or cleared by the [place] FDA and  has been authorized for detection and/or diagnosis of SARS-CoV-2 by FDA under an Emergency Use Authorization (EUA). This EUA will remain  in effect (meaning this test can be used) for t he duration of the COVID-19 declaration under Section 564(b)(1) of the Act, 21 U.S.C. section 360bbb-3(b)(1), unless the authorization is terminated or revoked sooner. Performed at [facility], [place] [address], [place], [place] [address]   Respiratory Panel by PCR     Status: None   Collection Time: 09/14/19  4:08 AM   Specimen: Nasopharyngeal Swab; Respiratory  Result Value Ref Range Status   Adenovirus NOT DETECTED NOT DETECTED Final   Coronavirus 229E NOT DETECTED NOT DETECTED Final    Comment: (NOTE) The Coronavirus on the Respiratory Panel, DOES NOT test for the novel  Coronavirus (2019 nCoV)    Coronavirus HKU1 NOT DETECTED NOT DETECTED Final   Coronavirus NL63 NOT DETECTED NOT DETECTED Final   Coronavirus OC43 NOT DETECTED NOT DETECTED Final   Metapneumovirus NOT DETECTED NOT DETECTED Final   Rhinovirus / Enterovirus NOT DETECTED NOT DETECTED Final   Influenza A NOT DETECTED NOT DETECTED Final   Influenza B NOT DETECTED NOT DETECTED Final   Parainfluenza Virus 1 NOT DETECTED NOT DETECTED Final   Parainfluenza Virus 2 NOT DETECTED NOT DETECTED Final   Parainfluenza Virus 3 NOT DETECTED NOT DETECTED Final   Parainfluenza Virus 4 NOT DETECTED NOT  DETECTED Final   Respiratory Syncytial Virus NOT DETECTED NOT DETECTED Final   Bordetella pertussis NOT DETECTED NOT DETECTED Final   Chlamydophila pneumoniae NOT DETECTED NOT DETECTED Final   Mycoplasma pneumoniae NOT DETECTED NOT DETECTED Final    Comment: Performed at [facility], [address], [place], [place] [address]    Radiology Reports CT Angio Chest PE W and/or Wo Contrast  Result Date: 09/13/2019 CLINICAL DATA:  53 year old male with chest pain. Concern for pulmonary embolism. EXAM: CT ANGIOGRAPHY CHEST WITH CONTRAST TECHNIQUE: Multidetector CT imaging of the chest was performed using the standard protocol during bolus administration of intravenous contrast. Multiplanar CT image reconstructions and MIPs were obtained to evaluate the vascular anatomy. CONTRAST:  15m OMNIPAQUE IOHEXOL 350 MG/ML SOLN COMPARISON:  Chest radiograph dated 09/13/2019. FINDINGS: Cardiovascular: Top-normal cardiac size. No pericardial effusion. The thoracic aorta is unremarkable. No pulmonary artery embolism identified. Mediastinum/Nodes: No hilar or mediastinal adenopathy. The esophagus and the thyroid gland are grossly unremarkable. No mediastinal fluid collection or hematoma. Lungs/Pleura: Bilateral patchy airspace opacities most consistent with multifocal pneumonia, likely atypical or viral etiology. Clinical correlation and follow-up to resolution recommended. There is no pleural effusion or pneumothorax. The central airways are patent. Upper Abdomen: No acute abnormality. Musculoskeletal: No chest wall abnormality. No acute or significant osseous findings. Review of the MIP images confirms the above findings. IMPRESSION: 1. No CT evidence of pulmonary embolism. 2. Multifocal pneumonia. Clinical correlation and follow-up to resolution recommended. Electronically Signed   By: AAnner CreteM.D.   On: 09/13/2019 22:51   DG Chest Port 1 View  Result Date: 09/16/2019 CLINICAL DATA:  Shortness of  breath and COVID-19 pneumonia. EXAM: PORTABLE CHEST 1 VIEW COMPARISON:  09/13/2019 FINDINGS: The heart size and mediastinal contours are within normal limits. Lung volumes are low bilaterally. Bilateral  pulmonary infiltrates predominantly in a lower lung zone distribution appear fairly stable without significant progression since the prior chest x-ray. Upper lung zones remain relatively well aerated. No overt pulmonary edema, pneumothorax or pleural fluid identified. The visualized skeletal structures are unremarkable. IMPRESSION: No significant change in bilateral pulmonary infiltrates since the prior chest x-ray. Electronically Signed   By: Aletta Edouard M.D.   On: 09/16/2019 09:29   DG Chest Portable 1 View  Result Date: 09/13/2019 CLINICAL DATA:  Shortness of breath, diabetes mellitus, hypertension EXAM: PORTABLE CHEST 1 VIEW COMPARISON:  Portable exam 1818 hours without priors for comparison FINDINGS: Upper normal heart size. Normal mediastinal contours and pulmonary vascularity. Patchy infiltrates at the inferior lungs bilaterally greater on LEFT question multifocal pneumonia, less likely atelectasis. Upper lungs clear. No pleural effusion or pneumothorax. IMPRESSION: Bibasilar opacities RIGHT greater than LEFT favor multifocal pneumonia over atelectasis. Electronically Signed   By: Lavonia Dana M.D.   On: 09/13/2019 19:09   ECHOCARDIOGRAM COMPLETE  Result Date: 09/16/2019   ECHOCARDIOGRAM REPORT   Patient Name:   Taylor Lynch Date of Exam: 09/16/2019 Medical Rec #:  [id number]        Height:       73.0 in Accession #:    [id number]       Weight:       207.7 lb Date of Birth:  July 16, 1966        BSA:          2.19 m Patient Age:    46 years         BP:           135/88 mmHg Patient Gender: M                HR:           102 bpm. Exam Location:  Inpatient Procedure: 2D Echo Indications:    Chest Pain 786.50 / R07.9  History:        Patient has no prior history of Echocardiogram examinations.                  Risk Factors:Hypertension and Diabetes. Covid 19. Acute Hypoxic                 Resp. Failure.  Sonographer:    Darlina Sicilian RDCS Referring Phys: [id number] [facility]  Sonographer Comments: [place]  1. Left ventricular ejection fraction, by visual estimation, is 60 to 65%. The left ventricle has normal function. There is moderately increased left ventricular hypertrophy.  2. Left ventricular diastolic parameters are consistent with Grade I diastolic dysfunction (impaired relaxation).  3. The left ventricle has no regional wall motion abnormalities.  4. Global right ventricle was not well visualized.The right ventricular size is not well visualized. Right vetricular wall thickness was not assessed.  5. Left atrial size was normal.  6. Right atrial size was not well visualized.  7. The mitral valve is grossly normal. No evidence of mitral valve regurgitation.  8. The tricuspid valve is not well visualized.  9. The aortic valve is tricuspid. Aortic valve regurgitation is not visualized. No evidence of aortic valve sclerosis or stenosis. 10. The pulmonic valve was grossly normal. Pulmonic valve regurgitation is not visualized. 11. Aortic dilatation noted. 12. There is mild dilatation of the ascending aorta. 13. The interatrial septum was not well visualized. FINDINGS  Left Ventricle: Left ventricular ejection fraction, by visual estimation, is 60 to 65%. The left ventricle has normal function.  The left ventricle has no regional wall motion abnormalities. There is moderately increased left ventricular hypertrophy. Concentric left ventricular hypertrophy. Left ventricular diastolic parameters are consistent with Grade I diastolic dysfunction (impaired relaxation). Normal left atrial pressure. Right Ventricle: The right ventricular size is not well visualized. Right vetricular wall thickness was not assessed. Global RV systolic function is was not well visualized. Left Atrium: Left atrial size  was normal in size. Right Atrium: Right atrial size was not well visualized Pericardium: There is no evidence of pericardial effusion. Mitral Valve: The mitral valve is grossly normal. No evidence of mitral valve regurgitation. Tricuspid Valve: The tricuspid valve is not well visualized. Tricuspid valve regurgitation is not demonstrated. Aortic Valve: The aortic valve is tricuspid. Aortic valve regurgitation is not visualized. The aortic valve is structurally normal, with no evidence of sclerosis or stenosis. Pulmonic Valve: The pulmonic valve was grossly normal. Pulmonic valve regurgitation is not visualized. Pulmonic regurgitation is not visualized. Aorta: The aortic root is normal in size and structure and aortic dilatation noted. There is mild dilatation of the ascending aorta. Venous: The inferior vena cava was not well visualized. IAS/Shunts: The interatrial septum was not well visualized.  LEFT VENTRICLE PLAX 2D LVIDd:         3.58 cm  Diastology LVIDs:         2.28 cm  LV e' lateral:   5.11 cm/s LV PW:         1.44 cm  LV E/e' lateral: 9.8 LV IVS:        1.11 cm  LV e' medial:    5.77 cm/s LVOT diam:     2.10 cm  LV E/e' medial:  8.7 LV SV:         36 ml LV SV Index:   16.23 LVOT Area:     3.46 cm  LEFT ATRIUM           Index LA diam:      3.10 cm 1.42 cm/m LA Vol (A4C): 33.3 ml 15.23 ml/m   AORTA Ao Root diam: 3.40 cm Ao Asc diam:  3.70 cm MITRAL VALVE MV Area (PHT): 3.60 cm             SHUNTS MV PHT:        61.19 msec           Systemic Diam: 2.10 cm MV Decel Time: 211 msec MV E velocity: 50.00 cm/s 103 cm/s MV A velocity: 70.30 cm/s 70.3 cm/s MV E/A ratio:  0.71       1.5  Kate Sable MD Electronically signed by Kate Sable MD Signature Date/Time: 09/16/2019/9:38:42 AM    Final

## 2019-09-19 LAB — BASIC METABOLIC PANEL
Anion gap: 10 (ref 5–15)
BUN: 36 mg/dL — ABNORMAL HIGH (ref 6–20)
CO2: 28 mmol/L (ref 22–32)
Calcium: 8.1 mg/dL — ABNORMAL LOW (ref 8.9–10.3)
Chloride: 103 mmol/L (ref 98–111)
Creatinine, Ser: 1.54 mg/dL — ABNORMAL HIGH (ref 0.61–1.24)
GFR calc Af Amer: 58 mL/min — ABNORMAL LOW (ref >60)
GFR calc non Af Amer: 50 mL/min — ABNORMAL LOW (ref >60)
Glucose, Bld: 138 mg/dL — ABNORMAL HIGH (ref 70–99)
Potassium: 3.7 mmol/L (ref 3.5–5.1)
Sodium: 141 mmol/L (ref 135–145)

## 2019-09-19 LAB — GLUCOSE, CAPILLARY
Glucose-Capillary: 580 mg/dL (ref 70–99)
Glucose-Capillary: 106 mg/dL — ABNORMAL HIGH (ref 70–99)
Glucose-Capillary: 353 mg/dL — ABNORMAL HIGH (ref 70–99)
Glucose-Capillary: 301 mg/dL — ABNORMAL HIGH (ref 70–99)
Glucose-Capillary: 426 mg/dL — ABNORMAL HIGH (ref 70–99)

## 2019-09-19 LAB — PROCALCITONIN: Procalcitonin: <0.1 ng/mL

## 2019-09-19 LAB — C-REACTIVE PROTEIN: CRP: 0.6 mg/dL (ref <1.0)

## 2019-09-19 LAB — D-DIMER, QUANTITATIVE: D-Dimer, Quant: 1.08 ug/mL-FEU — ABNORMAL HIGH (ref 0.00–0.50)

## 2019-09-19 MED ORDER — INSULIN ASPART 100 UNIT/ML ~~LOC~~ SOLN
15.0000 [IU] | Freq: Once | SUBCUTANEOUS | Status: AC
  Administered 2019-09-19: 15 [IU] via SUBCUTANEOUS

## 2019-09-19 MED ORDER — METHYLPREDNISOLONE SODIUM SUCC 40 MG IJ SOLR
20.0000 mg | Freq: Every day | INTRAMUSCULAR | Status: DC
  Administered 2019-09-20: 20 mg via INTRAVENOUS
  Filled 2019-09-19: qty 1

## 2019-09-19 MED ORDER — INSULIN GLARGINE 100 UNIT/ML ~~LOC~~ SOLN
40.0000 [IU] | Freq: Every day | SUBCUTANEOUS | Status: DC
  Administered 2019-09-19 – 2019-09-20 (×2): 40 [IU] via SUBCUTANEOUS
  Filled 2019-09-19 (×2): qty 0.4

## 2019-09-19 MED ORDER — INSULIN ASPART 100 UNIT/ML ~~LOC~~ SOLN
3.0000 [IU] | Freq: Three times a day (TID) | SUBCUTANEOUS | Status: DC
  Administered 2019-09-19 – 2019-09-20 (×5): 3 [IU] via SUBCUTANEOUS

## 2019-09-19 NOTE — Progress Notes (Signed)
PROGRESS NOTE                                                                                                                                                                                                             Patient Demographics:    Taylor Lynch, is a 53 y.o. male, DOB - 06-11-1966, IRJ:[id number]  Outpatient Primary MD for the patient is Patient, No Pcp Per    LOS - 5  Admit date - 09/13/2019    Chief complaint.  Shortness of breath     Brief Narrative  53 y.o.malewith medical history significant ofhypertension and type II diabetes who presented with increasing shortness of breath, further work-up suggested acute hypoxic respiratory failure due to COVID-19 pneumonia, he initially required 4 L nasal cannula oxygen was started on IV steroids and remdesivir but continued to get more hypoxic was eventually sent to [facility] on 7 L nasal cannula oxygen.   Subjective:   Patient in bed, appears comfortable, denies any headache, no fever, no chest pain or pressure, no shortness of breath , no abdominal pain. No focal weakness.   Assessment  & Plan :     1. Acute Hypoxic Resp. Failure due to Acute Covid 19 Viral Pneumonitis during the ongoing 2020 Covid 19 Pandemic - he had severe disease, currently requiring 7 L of oxygen, was unable to talk in full sentences and was working hard for breathing, CRP was greater than 20, CTA had ruled out PE, he was already on steroids and IV remdesivir which were continued, he was given a dose of Actemra on 09/14/2019 after appropriate consent, within 12 hours he has shown remarkable improvement he is down to RA at rest and 1 L nasal cannula oxygen upon activity and feeling better, continue tapering steroids.  And prepare for discharge possibly 09/20/2019.  Note patient has pleuritic chest pain which is preventing him from taking deep breaths and causing falls hypoxia, applied BenGay cream  and gave him 1 dose of tramadol after which his oxygen requirement has gone down from 15 L to 2 L.  Nursing staff educated.  D-dimer and CTA stable, stable echocardiogram & -ve Trop.   Encouraged the patient to sit up in chair in the daytime use I-S and flutter valve for pulmonary toiletry and then prone in bed when at night.   SpO2: 91 %  O2 Flow Rate (L/min): 1 L/min  Recent Labs  Lab 09/13/19 0000 09/13/19 1904 09/13/19 1940 09/14/19 1122 09/14/19 1323 09/15/19 0132 09/16/19 1015 09/17/19 0224 09/18/19 0152 09/19/19 0411  CRP  --   --    < >  --   --  15.6* 3.7* 1.9* 0.9 0.6  DDIMER  --   --   --    < > 0.82* 0.88* 2.98* 1.39* 1.03* 1.08*  BNP  --   --   --   --  35.8 40.9 65.2 87.9 56.0  --   PROCALCITON  --   --   --   --  0.16 0.17 0.15  --  0.11 <0.10  SARSCOV2NAA Detected* POSITIVE*  --   --   --   --   --   --   --   --    < > = values in this interval not displayed.    Hepatic Function Latest Ref Rng & Units 09/18/2019 09/17/2019 09/16/2019  Total Protein 6.5 - 8.1 g/dL 6.4(L) 6.3(L) 6.8  Albumin 3.5 - 5.0 g/dL 2.6(L) 2.4(L) 2.6(L)  AST 15 - 41 U/L 31 27 35  ALT 0 - 44 U/L 44 39 38  Alk Phosphatase 38 - 126 U/L 69 62 56  Total Bilirubin 0.3 - 1.2 mg/dL 0.5 0.6 0.8  Bilirubin, Direct 0.0 - 0.2 mg/dL - - -    2.  Essential hypertension.  Placed on Norvasc and hydralazine combination with good control.  3.  COVID-19 gastroenteritis causing dehydration.  Gentle IV fluids for hydration.  4.  AKI versus CKD 4.  No previous creatinine on file.  Renal function had improved with gentle diuresis, creatinine seems to have plateaued, outpatient nephrology and PCP follow-up.  5.  DM type II.  Poor outpatient control due to hyperglycemia Carma oral hypoglycemics on hold, with his underlying renal dysfunction will likely require insulin upon discharge, Lantus, premeal NovoLog adjusted, nighttime sliding scale held as steroids being Tapered.  Will order diabetic and insulin  education.  Lab Results  Component Value Date   HGBA1C 9.2 (H) 09/14/2019   CBG (last 3)  Recent Labs    09/18/19 1706 09/18/19 2034 09/19/19 0740  GLUCAP 321* 352* 106*      Condition - Extremely Guarded  Family Communication  :  None  Code Status :  Full  Diet :   Diet Order            Diet Heart Room service appropriate? Yes; Fluid consistency: Thin  Diet effective now               Disposition Plan  :  TBD  Consults  :  None  Procedures  :     TTE -   1. Left ventricular ejection fraction, by visual estimation, is 60 to 65%. The left ventricle has normal function. There is moderately increased left ventricular hypertrophy.  2. Left ventricular diastolic parameters are consistent with Grade I diastolic dysfunction (impaired relaxation).  3. The left ventricle has no regional wall motion abnormalities.  4. Global right ventricle was not well visualized.The right ventricular size is not well visualized. Right vetricular wall thickness was not assessed.  5. Left atrial size was normal.  6. Right atrial size was not well visualized.  7. The mitral valve is grossly normal. No evidence of mitral valve regurgitation.  8. The tricuspid valve is not well visualized.  9. The aortic valve is tricuspid. Aortic valve regurgitation is not visualized.  No evidence of aortic valve sclerosis or stenosis. 10. The pulmonic valve was grossly normal. Pulmonic valve regurgitation is not visualized. 11. Aortic dilatation noted. 12. There is mild dilatation of the ascending aorta. 13. The interatrial septum was not well visualized.  CTA - 1. No CT evidence of pulmonary embolism. 2. Multifocal pneumonia. Clinical correlation and follow-up to resolution recommended.  PUD Prophylaxis :    DVT Prophylaxis  :  Lovenox   Lab Results  Component Value Date   PLT 371 09/18/2019    Inpatient Medications  Scheduled Meds: . amLODipine  10 mg Oral Daily  . carvedilol  6.25 mg Oral  BID WC  . dextromethorphan-guaiFENesin  1 tablet Oral BID  . enoxaparin (LOVENOX) injection  40 mg Subcutaneous Daily  . insulin aspart  0-15 Units Subcutaneous TID WC  . insulin aspart  0-5 Units Subcutaneous QHS  . insulin aspart  8 Units Subcutaneous TID WC  . insulin glargine  50 Units Subcutaneous Daily  . methylPREDNISolone (SOLU-MEDROL) injection  40 mg Intravenous Daily  . simvastatin  20 mg Oral QPM  . timolol  1 drop Both Eyes BID   Continuous Infusions:  PRN Meds:.acetaminophen, guaiFENesin-dextromethorphan, loperamide, ondansetron (ZOFRAN) IV  Antibiotics  :    Anti-infectives (From admission, onward)   Start     Dose/Rate Route Frequency Ordered Stop   09/15/19 1000  remdesivir 100 mg in sodium chloride 0.9 % 100 mL IVPB     100 mg 200 mL/hr over 30 Minutes Intravenous Daily 09/14/19 0150 09/18/19 1253   09/14/19 0230  remdesivir 200 mg in sodium chloride 0.9% 250 mL IVPB     200 mg 580 mL/hr over 30 Minutes Intravenous Once 09/14/19 0150 09/14/19 0456       Time Spent in minutes  30   Lala Lund M.D on 09/19/2019 at 9:46 AM  To page go to [email or web address] - password [facility]  [facility] -  Office  [phone]    See all Orders from today for further details    Objective:   Vitals:   09/18/19 1949 09/19/19 0400 09/19/19 0746 09/19/19 0833  BP: 133/87 (!) 149/94 134/77 117/84  Pulse: 86 68 75 91  Resp: 18 20 (!) 25 (!) 22  Temp: 98.5 F (36.9 C) 98.1 F (36.7 C) 99 F (37.2 C) 98.3 F (36.8 C)  TempSrc: Oral Oral  Oral  SpO2: 90% 92% (!) 89% 91%  Weight:      Height:        Wt Readings from Last 3 Encounters:  09/14/19 94.2 kg     Intake/Output Summary (Last 24 hours) at 09/19/2019 0946 Last data filed at 09/19/2019 0900 Gross per 24 hour  Intake 1280 ml  Output 2201 ml  Net -921 ml     Physical Exam  Awake Alert,  No new F.N deficits, Normal affect [place].AT,PERRAL Supple Neck,No JVD, No cervical lymphadenopathy appriciated.    Symmetrical Chest wall movement, Good air movement bilaterally, CTAB RRR,No Gallops, Rubs or new Murmurs, No Parasternal Heave +ve B.Sounds, Abd Soft, No tenderness, No organomegaly appriciated, No rebound - guarding or rigidity. No Cyanosis, Clubbing or edema, No new Rash or bruise    Data Review:    CBC Recent Labs  Lab 09/14/19 0408 09/15/19 0132 09/16/19 1015 09/17/19 0224 09/18/19 0152  WBC 9.8 7.7 16.5* 17.2* 20.3*  HGB 13.6 12.9* 13.7 13.8 14.2  HCT 40.5 39.1 40.9 41.1 42.6  PLT 215 247 319 349 371  MCV 83.3 81.8 82.5  81.4 82.4  MCH 28.0 27.0 27.6 27.3 27.5  MCHC 33.6 33.0 33.5 33.6 33.3  RDW 14.6 14.5 14.6 14.6 14.6  LYMPHSABS  --  0.5* 0.6* 0.8 1.1  MONOABS  --  0.5 1.0 1.3* 1.5*  EOSABS  --  0.0 0.0 0.0 0.0  BASOSABS  --  0.0 0.0 0.0 0.1    Chemistries  Recent Labs  Lab 09/13/19 1850 09/15/19 0132 09/16/19 1015 09/17/19 0224 09/18/19 0152 09/19/19 0411  NA  --  137 139 139 139 141  K  --  3.5 3.6 3.8 3.8 3.7  CL  --  99 101 100 101 103  CO2  --  '28 26 27 25 28  ' GLUCOSE  --  145* 212* 218* 181* 138*  BUN  --  40* 42* 41* 38* 36*  CREATININE  --  1.85* 1.60* 1.51* 1.47* 1.54*  CALCIUM  --  7.5* 7.8* 7.9* 7.9* 8.1*  MG  --  2.1 2.3 2.4 2.3  --   AST 49* 34 35 27 31  --   ALT 36 33 38 39 44  --   ALKPHOS 52 50 56 62 69  --   BILITOT 0.8 0.9 0.8 0.6 0.5  --    ------------------------------------------------------------------------------------------------------------------ No results for input(s): CHOL, HDL, LDLCALC, TRIG, CHOLHDL, LDLDIRECT in the last 72 hours.  Lab Results  Component Value Date   HGBA1C 9.2 (H) 09/14/2019   ------------------------------------------------------------------------------------------------------------------ No results for input(s): TSH, T4TOTAL, T3FREE, THYROIDAB in the last 72 hours.  Invalid input(s): FREET3  Cardiac Enzymes No results for input(s): CKMB, TROPONINI, MYOGLOBIN in the last 168 hours.  Invalid  input(s): CK ------------------------------------------------------------------------------------------------------------------    Component Value Date/Time   BNP 56.0 09/18/2019 0152    Micro Results Recent Results (from the past 240 hour(s))  Novel Coronavirus, NAA ([facility])     Status: Abnormal   Collection Time: 09/13/19 12:00 AM   Specimen: Nasopharyngeal(NP) swabs in vial transport medium   NASOPHARYNGE  TESTING  Result Value Ref Range Status   SARS-CoV-2, NAA Detected (A) Not Detected Final    Comment: This nucleic acid amplification test was developed and its performance characteristics determined by [employer]. Nucleic acid amplification tests include PCR and TMA. This test has not been FDA cleared or approved. This test has been authorized by FDA under an Emergency Use Authorization (EUA). This test is only authorized for the duration of time the declaration that circumstances exist justifying the authorization of the emergency use of in vitro diagnostic tests for detection of SARS-CoV-2 virus and/or diagnosis of COVID-19 infection under section 564(b)(1) of the Act, 21 U.S.C. 009QZR-0(Q) (1), unless the authorization is terminated or revoked sooner. When diagnostic testing is negative, the possibility of a false negative result should be considered in the context of a patient's recent exposures and the presence of clinical signs and symptoms consistent with COVID-19. An individual without symptoms of COVID-19 and who is not shedding SARS-CoV-2 virus would  expect to have a negative (not detected) result in this assay.   SARS CORONAVIRUS 2 (TAT 6-24 HRS) Nasopharyngeal Nasopharyngeal Swab     Status: Abnormal   Collection Time: 09/13/19  7:04 PM   Specimen: Nasopharyngeal Swab  Result Value Ref Range Status   SARS Coronavirus 2 POSITIVE (A) NEGATIVE Final    Comment: RESULT CALLED TO, READ BACK BY AND VERIFIED WITH: Neldon Labella 0131 09/14/2019 D  BRADLEY (NOTE) SARS-CoV-2 target nucleic acids are DETECTED. The SARS-CoV-2 RNA is generally detectable in upper and  lower respiratory specimens during the acute phase of infection. Positive results are indicative of the presence of SARS-CoV-2 RNA. Clinical correlation with patient history and other diagnostic information is  necessary to determine patient infection status. Positive results do not rule out bacterial infection or co-infection with other viruses.  The expected result is Negative. Fact Sheet for Patients: SugarRoll.be Fact Sheet for Healthcare Providers: https://www.woods-mathews.com/ This test is not yet approved or cleared by the [place] FDA and  has been authorized for detection and/or diagnosis of SARS-CoV-2 by FDA under an Emergency Use Authorization (EUA). This EUA will remain  in effect (meaning this test can be used) for t he duration of the COVID-19 declaration under Section 564(b)(1) of the Act, 21 U.S.C. section 360bbb-3(b)(1), unless the authorization is terminated or revoked sooner. Performed at [facility], [place] [address], [place], [place] [address]   Respiratory Panel by PCR     Status: None   Collection Time: 09/14/19  4:08 AM   Specimen: Nasopharyngeal Swab; Respiratory  Result Value Ref Range Status   Adenovirus NOT DETECTED NOT DETECTED Final   Coronavirus 229E NOT DETECTED NOT DETECTED Final    Comment: (NOTE) The Coronavirus on the Respiratory Panel, DOES NOT test for the novel  Coronavirus (2019 nCoV)    Coronavirus HKU1 NOT DETECTED NOT DETECTED Final   Coronavirus NL63 NOT DETECTED NOT DETECTED Final   Coronavirus OC43 NOT DETECTED NOT DETECTED Final   Metapneumovirus NOT DETECTED NOT DETECTED Final   Rhinovirus / Enterovirus NOT DETECTED NOT DETECTED Final   Influenza A NOT DETECTED NOT DETECTED Final   Influenza B NOT DETECTED NOT DETECTED Final   Parainfluenza Virus 1 NOT DETECTED  NOT DETECTED Final   Parainfluenza Virus 2 NOT DETECTED NOT DETECTED Final   Parainfluenza Virus 3 NOT DETECTED NOT DETECTED Final   Parainfluenza Virus 4 NOT DETECTED NOT DETECTED Final   Respiratory Syncytial Virus NOT DETECTED NOT DETECTED Final   Bordetella pertussis NOT DETECTED NOT DETECTED Final   Chlamydophila pneumoniae NOT DETECTED NOT DETECTED Final   Mycoplasma pneumoniae NOT DETECTED NOT DETECTED Final    Comment: Performed at [facility], [address] [place], [place] [address]    Radiology Reports CT Angio Chest PE W and/or Wo Contrast  Result Date: 09/13/2019 CLINICAL DATA:  53 year old male with chest pain. Concern for pulmonary embolism. EXAM: CT ANGIOGRAPHY CHEST WITH CONTRAST TECHNIQUE: Multidetector CT imaging of the chest was performed using the standard protocol during bolus administration of intravenous contrast. Multiplanar CT image reconstructions and MIPs were obtained to evaluate the vascular anatomy. CONTRAST:  80m OMNIPAQUE IOHEXOL 350 MG/ML SOLN COMPARISON:  Chest radiograph dated 09/13/2019. FINDINGS: Cardiovascular: Top-normal cardiac size. No pericardial effusion. The thoracic aorta is unremarkable. No pulmonary artery embolism identified. Mediastinum/Nodes: No hilar or mediastinal adenopathy. The esophagus and the thyroid gland are grossly unremarkable. No mediastinal fluid collection or hematoma. Lungs/Pleura: Bilateral patchy airspace opacities most consistent with multifocal pneumonia, likely atypical or viral etiology. Clinical correlation and follow-up to resolution recommended. There is no pleural effusion or pneumothorax. The central airways are patent. Upper Abdomen: No acute abnormality. Musculoskeletal: No chest wall abnormality. No acute or significant osseous findings. Review of the MIP images confirms the above findings. IMPRESSION: 1. No CT evidence of pulmonary embolism. 2. Multifocal pneumonia. Clinical correlation and follow-up to  resolution recommended. Electronically Signed   By: AAnner CreteM.D.   On: 09/13/2019 22:51   [facility]  Result  Date: 09/16/2019 CLINICAL DATA:  Shortness of breath and COVID-19 pneumonia. EXAM: PORTABLE CHEST 1 VIEW COMPARISON:  09/13/2019 FINDINGS: The heart size and mediastinal contours are within normal limits. Lung volumes are low bilaterally. Bilateral pulmonary infiltrates predominantly in a lower lung zone distribution appear fairly stable without significant progression since the prior chest x-ray. Upper lung zones remain relatively well aerated. No overt pulmonary edema, pneumothorax or pleural fluid identified. The visualized skeletal structures are unremarkable. IMPRESSION: No significant change in bilateral pulmonary infiltrates since the prior chest x-ray. Electronically Signed   By: Aletta Edouard M.D.   On: 09/16/2019 09:29   DG Chest Portable 1 View  Result Date: 09/13/2019 CLINICAL DATA:  Shortness of breath, diabetes mellitus, hypertension EXAM: PORTABLE CHEST 1 VIEW COMPARISON:  Portable exam 1818 hours without priors for comparison FINDINGS: Upper normal heart size. Normal mediastinal contours and pulmonary vascularity. Patchy infiltrates at the inferior lungs bilaterally greater on LEFT question multifocal pneumonia, less likely atelectasis. Upper lungs clear. No pleural effusion or pneumothorax. IMPRESSION: Bibasilar opacities RIGHT greater than LEFT favor multifocal pneumonia over atelectasis. Electronically Signed   By: Lavonia Dana M.D.   On: 09/13/2019 19:09   ECHOCARDIOGRAM COMPLETE  Result Date: 09/16/2019   ECHOCARDIOGRAM REPORT   Patient Name:   Taylor Lynch Date of Exam: 09/16/2019 Medical Rec #:  [id number]        Height:       73.0 in Accession #:    [id number]       Weight:       207.7 lb Date of Birth:  1966-02-08        BSA:          2.19 m Patient Age:    89 years         BP:           135/88 mmHg Patient Gender: M                HR:            102 bpm. Exam Location:  Inpatient Procedure: 2D Echo Indications:    Chest Pain 786.50 / R07.9  History:        Patient has no prior history of Echocardiogram examinations.                 Risk Factors:Hypertension and Diabetes. Covid 19. Acute Hypoxic                 Resp. Failure.  Sonographer:    Darlina Sicilian RDCS Referring Phys: [id number] [facility]  Sonographer Comments: [place]  1. Left ventricular ejection fraction, by visual estimation, is 60 to 65%. The left ventricle has normal function. There is moderately increased left ventricular hypertrophy.  2. Left ventricular diastolic parameters are consistent with Grade I diastolic dysfunction (impaired relaxation).  3. The left ventricle has no regional wall motion abnormalities.  4. Global right ventricle was not well visualized.The right ventricular size is not well visualized. Right vetricular wall thickness was not assessed.  5. Left atrial size was normal.  6. Right atrial size was not well visualized.  7. The mitral valve is grossly normal. No evidence of mitral valve regurgitation.  8. The tricuspid valve is not well visualized.  9. The aortic valve is tricuspid. Aortic valve regurgitation is not visualized. No evidence of aortic valve sclerosis or stenosis. 10. The pulmonic valve was grossly normal. Pulmonic valve regurgitation is not visualized. 11. Aortic dilatation noted. 12. There  is mild dilatation of the ascending aorta. 13. The interatrial septum was not well visualized. FINDINGS  Left Ventricle: Left ventricular ejection fraction, by visual estimation, is 60 to 65%. The left ventricle has normal function. The left ventricle has no regional wall motion abnormalities. There is moderately increased left ventricular hypertrophy. Concentric left ventricular hypertrophy. Left ventricular diastolic parameters are consistent with Grade I diastolic dysfunction (impaired relaxation). Normal left atrial pressure. Right Ventricle: The  right ventricular size is not well visualized. Right vetricular wall thickness was not assessed. Global RV systolic function is was not well visualized. Left Atrium: Left atrial size was normal in size. Right Atrium: Right atrial size was not well visualized Pericardium: There is no evidence of pericardial effusion. Mitral Valve: The mitral valve is grossly normal. No evidence of mitral valve regurgitation. Tricuspid Valve: The tricuspid valve is not well visualized. Tricuspid valve regurgitation is not demonstrated. Aortic Valve: The aortic valve is tricuspid. Aortic valve regurgitation is not visualized. The aortic valve is structurally normal, with no evidence of sclerosis or stenosis. Pulmonic Valve: The pulmonic valve was grossly normal. Pulmonic valve regurgitation is not visualized. Pulmonic regurgitation is not visualized. Aorta: The aortic root is normal in size and structure and aortic dilatation noted. There is mild dilatation of the ascending aorta. Venous: The inferior vena cava was not well visualized. IAS/Shunts: The interatrial septum was not well visualized.  LEFT VENTRICLE PLAX 2D LVIDd:         3.58 cm  Diastology LVIDs:         2.28 cm  LV e' lateral:   5.11 cm/s LV PW:         1.44 cm  LV E/e' lateral: 9.8 LV IVS:        1.11 cm  LV e' medial:    5.77 cm/s LVOT diam:     2.10 cm  LV E/e' medial:  8.7 LV SV:         36 ml LV SV Index:   16.23 LVOT Area:     3.46 cm  LEFT ATRIUM           Index LA diam:      3.10 cm 1.42 cm/m LA Vol (A4C): 33.3 ml 15.23 ml/m   AORTA Ao Root diam: 3.40 cm Ao Asc diam:  3.70 cm MITRAL VALVE MV Area (PHT): 3.60 cm             SHUNTS MV PHT:        61.19 msec           Systemic Diam: 2.10 cm MV Decel Time: 211 msec MV E velocity: 50.00 cm/s 103 cm/s MV A velocity: 70.30 cm/s 70.3 cm/s MV E/A ratio:  0.71       1.5  Kate Sable MD Electronically signed by Kate Sable MD Signature Date/Time: 09/16/2019/9:38:42 AM    Final

## 2019-09-19 NOTE — Progress Notes (Signed)
Patient was weaned to 1L/[place] this morning and transferred with standby assist to chair. Patient VSS stable.  Patient oxygen increased to 2L/[place] to maintain O2 saturation above 90%. Patient was SOB with exertion using the urinal, O2 saturation @ 86%. Patient returned to chair and oxygen increased. Patient recovered with O2 saturation at 92%. Will continue to monitor.

## 2019-09-19 NOTE — Progress Notes (Signed)
Inpatient Diabetes Program Recommendations  AACE/ADA: New Consensus Statement on Inpatient Glycemic Control (2015)  Target Ranges:  Prepandial:   less than 140 mg/dL      Peak postprandial:   less than 180 mg/dL (1-2 hours)      Critically ill patients:  140 - 180 mg/dL   Results for TREVIOUS, RAMPEY (MRN [id number]) as of 09/19/2019 14:52  Ref. Range 09/18/2019 08:34 09/18/2019 12:25 09/18/2019 17:06 09/18/2019 20:34  Glucose-Capillary Latest Ref Range: 70 - 99 mg/dL 146 (H)  10 units NOVOLOG +  50 units LANTUS  264 (H)  13 units NOVOLOG  321 (H)  19 units NOVOLOG  352 (H)  5 units NOVOLOG    Results for ASAHD, CAN (MRN [id number]) as of 09/19/2019 14:52  Ref. Range 09/19/2019 07:40 09/19/2019 11:26  Glucose-Capillary Latest Ref Range: 70 - 99 mg/dL 106 (H)  8 units NOVOLOG  353 (H)  18 units NOVOLOG +  40 units LANTUS    Results for FREDDI, SCHRAGER (MRN [id number]) as of 09/19/2019 14:52  Ref. Range 09/14/2019 04:08  Hemoglobin A1C Latest Ref Range: 4.8 - 5.6 % 9.2 (H)     Home DM Meds: Metformin 1000 mg BID       Glipizide 10 mg Daily  Current Orders: Lantus 40 units Daily      Novolog Moderate Correction Scale/ SSI (0-15 units) TID AC       Novolog 3 units TID with meals     Getting Solumedrol 20 mg Daily (reduced today).  Per MD notes, patient will likely require insulin for home at time of discharge.  Spoke w/ RN caring for pt today--asked RN to make sure pt was given the Living well with diabetes book and the Insulin teaching kit that were both ordered on Saturday, 12/26.  Also asked RN to please begin insulin teaching with pt asap.  Asked RN to please allow pt to give all injections that are scheduled to be given.    Spoke with pt by phone today.  Discussed w/ pt that given his acute illness, pt may need insulin when he goes home per MD notes.  Discussed w/ pt that I am not sure how long he will need insulin at home (possibly short-term  while dealing with his CVOID infection or possibly long-term).  Discussed w/ pt that he has been requiring a large amount of insulin so far while he has been sick/getting steroids and that he will likely need to continue insulin until he improves.  Stressed to pt the importance of following up with his PCP (pt states his MD at his workplace manages his DM meds and refills his Rxs).    Explained what Lantus and Novolog insulins are, how to take, when to take, etc.  Stressed to pt the importance of taking his time when he goes home and making sure he is giving himself the right insulin, at the right time, in the right amount.  Reviewed importance of rotation of injection sites and also reviewed storage of both open and unopened insulin.  Encouraged pt to give all injections in the hospital as possible for practice.  Reviewed signs and symptoms of Hypoglycemia and how to treat at home.  Told pt I will attach several learning tools and some you tube videos to his d/c paperwork to review as well.  Reviewed CBG goals for home as well.    --Will follow patient during hospitalization--  Wyn Quaker RN, MSN, CDE Diabetes Coordinator Inpatient Glycemic Control Team  Team Pager: [phone] (8a-5p)

## 2019-09-20 LAB — CBC WITH DIFFERENTIAL/PLATELET
Abs Immature Granulocytes: 1.79 10*3/uL — ABNORMAL HIGH (ref 0.00–0.07)
Basophils Absolute: 0.1 10*3/uL (ref 0.0–0.1)
Basophils Relative: 1 %
Eosinophils Absolute: 0.2 10*3/uL (ref 0.0–0.5)
Eosinophils Relative: 1 %
HCT: 43.1 % (ref 39.0–52.0)
Hemoglobin: 14.1 g/dL (ref 13.0–17.0)
Immature Granulocytes: 9 %
Lymphocytes Relative: 11 %
Lymphs Abs: 2.1 10*3/uL (ref 0.7–4.0)
MCH: 27.2 pg (ref 26.0–34.0)
MCHC: 32.7 g/dL (ref 30.0–36.0)
MCV: 83 fL (ref 80.0–100.0)
Monocytes Absolute: 1.4 10*3/uL — ABNORMAL HIGH (ref 0.1–1.0)
Monocytes Relative: 7 %
Neutro Abs: 14.1 10*3/uL — ABNORMAL HIGH (ref 1.7–7.7)
Neutrophils Relative %: 71 %
Platelets: 408 10*3/uL — ABNORMAL HIGH (ref 150–400)
RBC: 5.19 MIL/uL (ref 4.22–5.81)
RDW: 15.1 % (ref 11.5–15.5)
WBC: 19.6 10*3/uL — ABNORMAL HIGH (ref 4.0–10.5)
nRBC: 2 % — ABNORMAL HIGH (ref 0.0–0.2)

## 2019-09-20 LAB — COMPREHENSIVE METABOLIC PANEL
ALT: 103 U/L — ABNORMAL HIGH (ref 0–44)
AST: 44 U/L — ABNORMAL HIGH (ref 15–41)
Albumin: 2.5 g/dL — ABNORMAL LOW (ref 3.5–5.0)
Alkaline Phosphatase: 61 U/L (ref 38–126)
Anion gap: 11 (ref 5–15)
BUN: 31 mg/dL — ABNORMAL HIGH (ref 6–20)
CO2: 27 mmol/L (ref 22–32)
Calcium: 8.3 mg/dL — ABNORMAL LOW (ref 8.9–10.3)
Chloride: 104 mmol/L (ref 98–111)
Creatinine, Ser: 1.35 mg/dL — ABNORMAL HIGH (ref 0.61–1.24)
GFR calc Af Amer: >60 mL/min (ref >60)
GFR calc non Af Amer: 59 mL/min — ABNORMAL LOW (ref >60)
Glucose, Bld: 112 mg/dL — ABNORMAL HIGH (ref 70–99)
Potassium: 3.7 mmol/L (ref 3.5–5.1)
Sodium: 142 mmol/L (ref 135–145)
Total Bilirubin: 0.5 mg/dL (ref 0.3–1.2)
Total Protein: 5.8 g/dL — ABNORMAL LOW (ref 6.5–8.1)

## 2019-09-20 LAB — C-REACTIVE PROTEIN: CRP: 0.7 mg/dL (ref <1.0)

## 2019-09-20 LAB — GLUCOSE, CAPILLARY
Glucose-Capillary: 197 mg/dL — ABNORMAL HIGH (ref 70–99)
Glucose-Capillary: 239 mg/dL — ABNORMAL HIGH (ref 70–99)
Glucose-Capillary: 370 mg/dL — ABNORMAL HIGH (ref 70–99)

## 2019-09-20 LAB — BRAIN NATRIURETIC PEPTIDE: B Natriuretic Peptide: 51.3 pg/mL (ref 0.0–100.0)

## 2019-09-20 LAB — D-DIMER, QUANTITATIVE: D-Dimer, Quant: 0.93 ug/mL-FEU — ABNORMAL HIGH (ref 0.00–0.50)

## 2019-09-20 LAB — MAGNESIUM: Magnesium: 2 mg/dL (ref 1.7–2.4)

## 2019-09-20 LAB — PROCALCITONIN: Procalcitonin: <0.1 ng/mL

## 2019-09-20 MED ORDER — ALBUTEROL SULFATE HFA 108 (90 BASE) MCG/ACT IN AERS: 2.0000 | INHALATION_SPRAY | Freq: Four times a day (QID) | RESPIRATORY_TRACT | 0 refills | Status: AC | PRN

## 2019-09-20 MED ORDER — INSULIN GLARGINE 100 UNIT/ML ~~LOC~~ SOLN: 25.0000 [IU] | Freq: Every day | SUBCUTANEOUS | 0 refills | Status: AC

## 2019-09-20 MED ORDER — METHYLPREDNISOLONE 4 MG PO TBPK: ORAL_TABLET | ORAL | 0 refills | Status: AC

## 2019-09-20 MED ORDER — CARVEDILOL 6.25 MG PO TABS: 6.2500 mg | ORAL_TABLET | Freq: Two times a day (BID) | ORAL | 0 refills | Status: AC

## 2019-09-20 MED ORDER — AMLODIPINE BESYLATE 10 MG PO TABS: 10.0000 mg | ORAL_TABLET | Freq: Every day | ORAL | 0 refills | Status: AC

## 2019-09-20 MED ORDER — INSULIN ASPART 100 UNIT/ML ~~LOC~~ SOLN: SUBCUTANEOUS | 0 refills | Status: AC

## 2019-09-20 NOTE — Discharge Instructions (Signed)
Follow with Primary MD in 7 days   Get CBC, CMP, 2 view Chest X ray -  checked next visit within 1 week by Primary MD    Activity: As tolerated with Full fall precautions use walker/cane & assistance as needed  Disposition Home   Diet: Heart Healthy Low Carb  Accuchecks 4 times/day, Once in AM empty stomach and then before each meal. Log in all results and show them to your Prim.MD in 3 days. If any glucose reading is under 80 or above 300 call your Prim MD immidiately. Follow Low glucose instructions for glucose under 80 as instructed.    Special Instructions: If you have smoked or chewed Tobacco  in the last 2 yrs please stop smoking, stop any regular Alcohol  and or any Recreational drug use.  On your next visit with your primary care physician please Get Medicines reviewed and adjusted.  Please request your Prim.MD to go over all Hospital Tests and Procedure/Radiological results at the follow up, please get all Hospital records sent to your Prim MD by signing hospital release before you go home.  If you experience worsening of your admission symptoms, develop shortness of breath, life threatening emergency, suicidal or homicidal thoughts you must seek medical attention immediately by calling 911 or calling your MD immediately  if symptoms less severe.  You Must read complete instructions/literature along with all the possible adverse reactions/side effects for all the Medicines you take and that have been prescribed to you. Take any new Medicines after you have completely understood and accpet all the possible adverse reactions/side effects.     Fingerstick glucose (sugar) goals for home: Before meals: 80-130 mg/dl 2-Hours after meals: less than 180 mg/dl Hemoglobin A1c goal: 7% or less  Check your sugar levels 3 times per day before all meals  [email or web address] Videos to  Watch:  [email or web address].info/  [email or web address]     Symptoms of Hypoglycemia: Silly, Sweaty, Shaky Check sugar if you have your meter.  If near or less than 80 mg/dl, treat with 1/2 cup juice or soda or take glucose tablets Check sugar 15 minutes after treatment.  If sugar still near or less than 80 mg/dl and symptomatic, treat again and may need a snack with some protein (peanut butter with crackers, etc)    Insulin Pen Instructions:  1. Remove Insulin pen cap and clean pen 1st with alcohol rub and then clean skin 2nd with alcohol rub 2. Twist insulin pen needle onto pen (right tighty) 3. Remove outer cap and inner cap from needle 4. Dial pen to 2 units and perform prime- press pen to zero and make sure liquid (insulin) comes out of the needle 5. Dial pen to your dose and perform injection into your abdomen 6. Hold needle in skin for 10 seconds after injection 7. Remove needle from insulin pen and discard 8. Place cap back on insulin pen and store safely (at room temperature) 9. Store unused pens in refrigerator and can keep opened insulin pen at room temperature (discard used pen after 30 days)      Person Under Monitoring Name: Taylor Lynch  Location: [address] [place]  [address]   Infection Prevention Recommendations for Individuals Confirmed to have, or Being Evaluated for, 2019 Novel Coronavirus (COVID-19) Infection Who Receive Care at Home  Individuals who are confirmed to have, or are being evaluated for, COVID-19 should follow the prevention steps below until a healthcare provider or local or state health department says they can return to  normal activities.  Stay home except to get medical care You should restrict activities outside your home, except for getting medical care. Do not go to work, school, or public areas, and do not use public transportation or taxis.  Call ahead before visiting your  doctor Before your medical appointment, call the healthcare provider and tell them that you have, or are being evaluated for, COVID-19 infection. This will help the healthcare providers office take steps to keep other people from getting infected. Ask your healthcare provider to call the local or state health department.  Monitor your symptoms Seek prompt medical attention if your illness is worsening (e.g., difficulty breathing). Before going to your medical appointment, call the healthcare provider and tell them that you have, or are being evaluated for, COVID-19 infection. Ask your healthcare provider to call the local or state health department.  Wear a facemask You should wear a facemask that covers your nose and mouth when you are in the same room with other people and when you visit a healthcare provider. People who live with or visit you should also wear a facemask while they are in the same room with you.  Separate yourself from other people in your home As much as possible, you should stay in a different room from other people in your home. Also, you should use a separate bathroom, if available.  Avoid sharing household items You should not share dishes, drinking glasses, cups, eating utensils, towels, bedding, or other items with other people in your home. After using these items, you should wash them thoroughly with soap and water.  Cover your coughs and sneezes Cover your mouth and nose with a tissue when you cough or sneeze, or you can cough or sneeze into your sleeve. Throw used tissues in a lined trash can, and immediately wash your hands with soap and water for at least 20 seconds or use an alcohol-based hand rub.  Wash your [facility] your hands often and thoroughly with soap and water for at least 20 seconds. You can use an alcohol-based hand sanitizer if soap and water are not available and if your hands are not visibly dirty. Avoid touching your eyes, nose,  and mouth with unwashed hands.   Prevention Steps for Caregivers and Household Members of Individuals Confirmed to have, or Being Evaluated for, COVID-19 Infection Being Cared for in the Home  If you live with, or provide care at home for, a person confirmed to have, or being evaluated for, COVID-19 infection please follow these guidelines to prevent infection:  Follow healthcare providers instructions Make sure that you understand and can help the patient follow any healthcare provider instructions for all care.  Provide for the patients basic needs You should help the patient with basic needs in the home and provide support for getting groceries, prescriptions, and other personal needs.  Monitor the patients symptoms If they are getting sicker, call his or her medical provider and tell them that the patient has, or is being evaluated for, COVID-19 infection. This will help the healthcare providers office take steps to keep other people from getting infected. Ask the healthcare provider to call the local or state health department.  Limit the number of people who have contact with the patient  If possible, have only one caregiver for the patient.  Other household members should stay in another home or place of residence. If this is not possible, they should stay  in another room, or be separated from the patient  as much as possible. Use a separate bathroom, if available.  Restrict visitors who do not have an essential need to be in the home.  Keep older adults, very young children, and other sick people away from the patient Keep older adults, very young children, and those who have compromised immune systems or chronic health conditions away from the patient. This includes people with chronic heart, lung, or kidney conditions, diabetes, and cancer.  Ensure good ventilation Make sure that shared spaces in the home have good air flow, such as from an air conditioner or an  opened window, weather permitting.  Wash your hands often  Wash your hands often and thoroughly with soap and water for at least 20 seconds. You can use an alcohol based hand sanitizer if soap and water are not available and if your hands are not visibly dirty.  Avoid touching your eyes, nose, and mouth with unwashed hands.  Use disposable paper towels to dry your hands. If not available, use dedicated cloth towels and replace them when they become wet.  Wear a facemask and gloves  Wear a disposable facemask at all times in the room and gloves when you touch or have contact with the patients blood, body fluids, and/or secretions or excretions, such as sweat, saliva, sputum, nasal mucus, vomit, urine, or feces.  Ensure the mask fits over your nose and mouth tightly, and do not touch it during use.  Throw out disposable facemasks and gloves after using them. Do not reuse.  Wash your hands immediately after removing your facemask and gloves.  If your personal clothing becomes contaminated, carefully remove clothing and launder. Wash your hands after handling contaminated clothing.  Place all used disposable facemasks, gloves, and other waste in a lined container before disposing them with other household waste.  Remove gloves and wash your hands immediately after handling these items.  Do not share dishes, glasses, or other household items with the patient  Avoid sharing household items. You should not share dishes, drinking glasses, cups, eating utensils, towels, bedding, or other items with a patient who is confirmed to have, or being evaluated for, COVID-19 infection.  After the person uses these items, you should wash them thoroughly with soap and water.  Wash laundry thoroughly  Immediately remove and wash clothes or bedding that have blood, body fluids, and/or secretions or excretions, such as sweat, saliva, sputum, nasal mucus, vomit, urine, or feces, on them.  Wear gloves  when handling laundry from the patient.  Read and follow directions on labels of laundry or clothing items and detergent. In general, wash and dry with the warmest temperatures recommended on the label.  Clean all areas the individual has used often  Clean all touchable surfaces, such as counters, tabletops, doorknobs, bathroom fixtures, toilets, phones, keyboards, tablets, and bedside tables, every day. Also, clean any surfaces that may have blood, body fluids, and/or secretions or excretions on them.  Wear gloves when cleaning surfaces the patient has come in contact with.  Use a diluted bleach solution (e.g., dilute bleach with 1 part bleach and 10 parts water) or a household disinfectant with a label that says EPA-registered for coronaviruses. To make a bleach solution at home, add 1 tablespoon of bleach to 1 quart (4 cups) of water. For a larger supply, add  cup of bleach to 1 gallon (16 cups) of water.  Read labels of cleaning products and follow recommendations provided on product labels. Labels contain instructions for safe and effective use of  the cleaning product including precautions you should take when applying the product, such as wearing gloves or eye protection and making sure you have good ventilation during use of the product.  Remove gloves and wash hands immediately after cleaning.  Monitor yourself for signs and symptoms of illness Caregivers and household members are considered close contacts, should monitor their health, and will be asked to limit movement outside of the home to the extent possible. Follow the monitoring steps for close contacts listed on the symptom monitoring form.   ? If you have additional questions, contact your local health department or call the epidemiologist on call at [phone] (available 24/7). ? This guidance is subject to change. For the most up-to-date guidance from [facility], please refer to their  website: [email or web address].pl

## 2019-09-20 NOTE — Progress Notes (Signed)
Patient's wife came to pick up patient from the hospital, patient was brought downstairs in a wheelchair. Oxygen tank and tubing were in the car.

## 2019-09-20 NOTE — Progress Notes (Signed)
Occupational Therapy Treatment Patient Details Name: Taylor Lynch MRN: [id number] DOB: [birth year] Today's Date: 09/20/2019    History of present illness 53 y.o. male with medical history significant of hypertension and type II diabetes who presented with increasing shortness of breath, further work-up suggested acute hypoxic respiratory failure due to COVID-19 pneumonia, he initially required 4 L nasal cannula oxygen was started on IV steroids and remdesivir but continued to get more hypoxic was eventually sent to [facility] on 7 L nasal cannula oxygen.   OT comments  Pt progressing towards established OT goals. Pt performing dressing and mobility in hallway with Supervision-Min Guard A. Pt continues to present with decreased activity tolerance as seen by fatigue, shortness of breat, and decreased SpO2. Pt dropping to 77% on RA during mobility and required significant standing rest break with cues for purse lip breathing to return to >87%; pt would benefit from home O2 for safety during BADLs and mobility. Provided education and handout and EC for ADLs and IADLs; pt verbalized understanding. Continue to recommend dc to home and will continue to follow acutely as admitted.   Follow Up Recommendations  No OT follow up;Supervision/Assistance - 24 hour    Equipment Recommendations  Tub/shower seat    Recommendations for Other Services      Precautions / Restrictions Precautions Precautions: Fall Precaution Comments: Monitor O2 Restrictions Weight Bearing Restrictions: No       Mobility Bed Mobility Overal bed mobility: Modified Independent             General bed mobility comments: Increased time  Transfers Overall transfer level: Needs assistance Equipment used: None Transfers: Sit to/from Stand Sit to Stand: Supervision         General transfer comment: Supervision for safety    Balance Overall balance assessment: Mild deficits observed, not formally  tested Sitting-balance support: Feet supported Sitting balance-Leahy Scale: Good     Standing balance support: Bilateral upper extremity supported Standing balance-Leahy Scale: Fair                             ADL either performed or assessed with clinical judgement   ADL Overall ADL's : Needs assistance/impaired Eating/Feeding: Independent;Sitting Eating/Feeding Details (indicate cue type and reason): Breakfast at end of session             Upper Body Dressing : Set up;Supervision/safety;Standing Upper Body Dressing Details (indicate cue type and reason): Dopnning shirt with supervision in standing Lower Body Dressing: Min guard;Sit to/from stand Lower Body Dressing Details (indicate cue type and reason): Pt donning pants with Min Guard A for safety Toilet Transfer: Supervision/safety(simulated to recliner)           Functional mobility during ADLs: Min guard General ADL Comments: Pt continues to present with decreased activity tolerance as seen by SOB and Spo2 dropping with activity. Provided education and handout on [facility] for ADLs and IADLs.      Vision       Perception     Praxis      Cognition Arousal/Alertness: Awake/alert Behavior During Therapy: WFL for tasks assessed/performed Overall Cognitive Status: Within Functional Limits for tasks assessed                                 General Comments: Very motivated        Exercises Exercises: Other exercises Other Exercises Other Exercises: Flutter valve 15  reps Other Exercises: Provided EC education and handout. PT verbalized understanding   Shoulder Instructions       General Comments SpO2 dropping to 77% on RA during mobility on hallway. Pt able to elevate SpO2 back to 88% on RA with long standing rest break (~1-87minutes).     Pertinent Vitals/ Pain       Pain Assessment: No/denies pain  Home Living                                          Prior  Functioning/Environment              Frequency  Min 2X/week        Progress Toward Goals  OT Goals(current goals can now be found in the care plan section)  Progress towards OT goals: Progressing toward goals  Acute Rehab OT Goals Patient Stated Goal: to go home OT Goal Formulation: With patient Time For Goal Achievement: 10/01/19 Potential to Achieve Goals: Good ADL Goals Pt Will Perform Grooming: with modified independence;standing Pt Will Perform Lower Body Bathing: with modified independence;sit to/from stand Pt Will Perform Upper Body Dressing: with modified independence;sitting Pt Will Perform Lower Body Dressing: with modified independence;sit to/from stand Pt Will Transfer to Toilet: with modified independence;ambulating Pt Will Perform Toileting - Clothing Manipulation and hygiene: with modified independence;sit to/from stand Pt/caregiver will Perform Home Exercise Program: Increased strength;Both right and left upper extremity;With theraband Additional ADL Goal #1: Pt will verbalize/demonstrate at least 3 energy conservation techniques during functional task.  Plan Discharge plan remains appropriate    Co-evaluation                 AM-PAC OT "6 Clicks" Daily Activity     Outcome Measure   Help from another person eating meals?: None Help from another person taking care of personal grooming?: A Little Help from another person toileting, which includes using toliet, bedpan, or urinal?: A Little Help from another person bathing (including washing, rinsing, drying)?: A Little Help from another person to put on and taking off regular upper body clothing?: None Help from another person to put on and taking off regular lower body clothing?: A Little 6 Click Score: 20    End of Session    OT Visit Diagnosis: Muscle weakness (generalized) (M62.81);Unsteadiness on feet (R26.81)   Activity Tolerance Patient tolerated treatment well   Patient Left in  chair;with call bell/phone within reach   Nurse Communication Mobility status        Time: [id number] OT Time Calculation (min): 35 min  Charges: OT General Charges $OT Visit: 1 Visit OT Treatments $Self Care/Home Management : 23-37 mins  [place], OTR/L Acute Rehab Pager: [phone] Office: [place] 09/20/2019, 10:00 AM

## 2019-09-20 NOTE — Progress Notes (Signed)
Diabetes and insulin education done with patient. Patient was able to administer insulin himself and will lunch dose as well. All questions answered for patient but needs reinforcement.

## 2019-09-20 NOTE — Progress Notes (Signed)
Received glucometer for patient. Patient educated and return demonstration of glucometer. Patient supplies placed I n belongings bag.

## 2019-09-20 NOTE — Progress Notes (Signed)
Pt awake sitting in chair, ready to get washed this morning so he can "be ready for the doctor". Denies needing assistance, will round to check O2 sats after he is done washing

## 2019-09-20 NOTE — TOC Transition Note (Addendum)
Transition of Care [facility]) - CM/SW Discharge Note   Patient Details  Name: Taylor Lynch MRN: [id number] Date of Birth: [birth year]  [facility] CM/SW Contact:  Ninfa Meeker, RN Phone Number: [phone] (working remotely) 09/20/2019, 2:39 PM   Clinical Narrative:   Case manager spoke with patient via telephone concerning his need for oxygen at home and glucometer. Confirmed that patient's wife is home to receive concentrator. Case manager contacted Learta Codding with [facility] [place] and requested that a portable tank and glucometer be taken to patient's room. Patient understands that concentrator and tanks will be delivered to his home prior to his discharge. Case manager has arranged telephonic hospital  follow up call with [facility]. Appointment is for Monday, September 26, 2019. Appointment added to AVS. Case manager arranged for [facility] transport for 6:30pm . Per Magda Paganini oxygen will be delivered to home at around 6pm.     Final next level of care: Home/Self Care Barriers to Discharge: No Barriers Identified   Patient Goals and CMS Choice        Discharge Placement                       Discharge Plan and Services                DME Arranged: Oxygen DME Agency: [place]           Date [facility] Agency Contacted: 09/20/19 Time HH Agency Contacted: 1300 Representative spoke with at [place]: Learta Codding  Social Determinants of Health ([place]) Interventions     Readmission Risk Interventions No flowsheet data found.

## 2019-09-20 NOTE — Progress Notes (Signed)
Spoke with case management about arranging transportation for patient to go home. Patient states he needs transportation. Case manager setting up transportation and oxygen needs.

## 2019-09-20 NOTE — Discharge Summary (Signed)
Taylor Lynch ZMO:[id number] DOB: 1966-07-09 DOA: 09/13/2019  PCP: Patient, No Pcp Per  Admit date: 09/13/2019  Discharge date: 09/20/2019  Admitted From: Home  Disposition:  Home   Recommendations for Outpatient Follow-up:   Follow up with PCP in 1-2 weeks  PCP Please obtain BMP/CBC, 2 view CXR in 1week,  (see Discharge instructions)   PCP Please follow up on the following pending results:    Home Health: None   Equipment/Devices: o2 2lits PRN  Consultations: None  Discharge Condition: Stable    CODE STATUS: Full    Diet Recommendation: Heart Healthy Low Carb  Diet Order            Diet - low sodium heart healthy        Diet Heart Room service appropriate? Yes; Fluid consistency: Thin  Diet effective now               CC - SOB   Brief history of present illness from the day of admission and additional interim summary    53 y.o.malewith medical history significant ofhypertension and type II diabetes who presented with increasing shortness of breath, further work-up suggested acute hypoxic respiratory failure due to COVID-19 pneumonia, he initially required 4 L nasal cannula oxygen was started on IV steroids and remdesivir but continued to get more hypoxic was eventually sent to [facility] on 7 L nasal cannula oxygen.                                                                 Hospital Course   1. Acute Hypoxic Resp. Failure due to Acute Covid 19 Viral Pneumonitis during the ongoing 2020 Covid 19 Pandemic - he had severe disease, initially required between 7 and 10 L of oxygen, he was treated with IV steroids, IV remdesivir along with Actemra.  He showed rapid and good improvement now on room air at rest and upon ambulation requires 2 L of nasal cannula oxygen.  He will be discharged home on 2 L of  nasal cannula oxygen as needed upon ambulation along with oral steroid taper.  Follow with PCP in 7 to 10 days.   Recent Labs  Lab 09/13/19 1904 09/13/19 1940 09/15/19 0132 09/16/19 1015 09/17/19 0224 09/18/19 0152 09/19/19 0411 09/20/19 0324  CRP  --    < > 15.6* 3.7* 1.9* 0.9 0.6 0.7  DDIMER  --   --  0.88* 2.98* 1.39* 1.03* 1.08* 0.93*  BNP  --    < > 40.9 65.2 87.9 56.0  --  51.3  PROCALCITON  --    < > 0.17 0.15  --  0.11 <0.10 <0.10  SARSCOV2NAA POSITIVE*  --   --   --   --   --   --   --    < > =  values in this interval not displayed.    Hepatic Function Latest Ref Rng & Units 09/20/2019 09/18/2019 09/17/2019  Total Protein 6.5 - 8.1 g/dL 5.8(L) 6.4(L) 6.3(L)  Albumin 3.5 - 5.0 g/dL 2.5(L) 2.6(L) 2.4(L)  AST 15 - 41 U/L 44(H) 31 27  ALT 0 - 44 U/L 103(H) 44 39  Alk Phosphatase 38 - 126 U/L 61 69 62  Total Bilirubin 0.3 - 1.2 mg/dL 0.5 0.5 0.6  Bilirubin, Direct 0.0 - 0.2 mg/dL - - -     2.  Essential hypertension.    Blood pressure control was poor, he has been placed on Norvasc, Coreg and will resume his ACE inhibitor upon discharge.  3.  COVID-19 gastroenteritis causing dehydration.  All resolved with supportive care and IV fluids.  4.  AKI versus CKD 4.  No previous creatinine on file.  Renal function had improved with gentle diuresis, creatinine seems to have plateaued, outpatient nephrology and PCP follow-up.  Note he is resuming his home dose of ACE inhibitor and HCTZ after discharge.  PCP to monitor renal function closely along with blood pressure.  5.  DM type II.  Poor outpatient control due to hyperglycemia A1c was greater than 9, he will continue his home medications in addition Lantus and sliding scale have been added.  Follow with PCP for glycemic control.  Diabetic and insulin education provided here prior to discharge.   Discharge diagnosis     Principal Problem:   Acute respiratory failure with hypoxia (HCC) Active Problems:   Pneumonia due to  COVID-19 virus   Essential hypertension   Type 2 diabetes mellitus without complication ([place])   [id number]    Discharge instructions    Discharge Instructions    Diet - low sodium heart healthy   Complete by: As directed    Discharge instructions   Complete by: As directed    Follow with Primary MD in 7 days   Get CBC, CMP, 2 view Chest X ray -  checked next visit within 1 week by Primary MD    Activity: As tolerated with Full fall precautions use walker/cane & assistance as needed  Disposition Home   Diet: Heart Healthy Low Carb  Accuchecks 4 times/day, Once in AM empty stomach and then before each meal. Log in all results and show them to your Prim.MD in 3 days. If any glucose reading is under 80 or above 300 call your Prim MD immidiately. Follow Low glucose instructions for glucose under 80 as instructed.    Special Instructions: If you have smoked or chewed Tobacco  in the last 2 yrs please stop smoking, stop any regular Alcohol  and or any Recreational drug use.  On your next visit with your primary care physician please Get Medicines reviewed and adjusted.  Please request your Prim.MD to go over all Hospital Tests and Procedure/Radiological results at the follow up, please get all Hospital records sent to your Prim MD by signing hospital release before you go home.  If you experience worsening of your admission symptoms, develop shortness of breath, life threatening emergency, suicidal or homicidal thoughts you must seek medical attention immediately by calling 911 or calling your MD immediately  if symptoms less severe.  You Must read complete instructions/literature along with all the possible adverse reactions/side effects for all the Medicines you take and that have been prescribed to you. Take any new Medicines after you have completely understood and accpet all the possible adverse reactions/side effects.   Increase activity  slowly   Complete by: As directed     MyChart COVID-19 home monitoring program   Complete by: Sep 20, 2019    Is the patient willing to use the [employer] for home monitoring?: Yes   Temperature monitoring   Complete by: Sep 20, 2019    After how many days would you like to receive a notification of this patient's flowsheet entries?: 1      Discharge Medications   Allergies as of 09/20/2019   No Known Allergies     Medication List    TAKE these medications   albuterol 108 (90 Base) MCG/ACT inhaler Commonly known as: VENTOLIN HFA Inhale 2 puffs into the lungs every 6 (six) hours as needed for wheezing or shortness of breath.   amLODipine 10 MG tablet Commonly known as: NORVASC Take 1 tablet (10 mg total) by mouth daily. Start taking on: September 21, 2019   brimonidine 0.2 % ophthalmic solution Commonly known as: ALPHAGAN Place 1 drop into both eyes 3 (three) times daily.   carvedilol 6.25 MG tablet Commonly known as: COREG Take 1 tablet (6.25 mg total) by mouth 2 (two) times daily with a meal.   glipiZIDE 10 MG 24 hr tablet Commonly known as: GLUCOTROL XL Take 10 mg by mouth daily.   insulin aspart 100 UNIT/ML injection Commonly known as: NovoLOG Before each meal 3 times a day , 140-199 - 2 units, 200-250 - 4 units, 251-299 - 6 units,  300-349 - 8 units,  350 or above 10 units. Insulin PEN if approved, provide syringes and needles if needed.   insulin glargine 100 UNIT/ML injection Commonly known as: Lantus Inject 0.25 mLs (25 Units total) into the skin at bedtime. Dispense insulin pen if approved, if not dispense as needed syringes and needles for 1 month supply. Can switch to Levemir. Diagnosis E 11.65.   lisinopril-hydrochlorothiazide 20-12.5 MG tablet Commonly known as: ZESTORETIC Take 1 tablet by mouth daily.   metFORMIN 500 MG (MOD) 24 hr tablet Commonly known as: GLUMETZA Take 1,000 mg by mouth 2 (two) times daily with a meal.   methylPREDNISolone 4 MG Tbpk tablet Commonly known as:  MEDROL DOSEPAK follow package directions   simvastatin 20 MG tablet Commonly known as: ZOCOR Take 20 mg by mouth every evening.   timolol 0.5 % ophthalmic solution Commonly known as: TIMOPTIC Place 1 drop into both eyes 2 (two) times daily.            Durable Medical Equipment  (From admission, onward)         Start     Ordered   09/20/19 1004  For home use only DME oxygen  Once    Question Answer Comment  Length of Need 6 Months   Mode or (Route) Nasal cannula   Liters per Minute 2   Frequency Continuous (stationary and portable oxygen unit needed)   Oxygen conserving device Yes   Oxygen delivery system Gas      09/20/19 1003          Follow-up Information    [place] Schedule an appointment as soon as possible for a visit in 1 week(s).   Contact information: [address] [place] [address] [phone]          Major procedures and Radiology Reports - PLEASE review detailed and final reports thoroughly  -        CT Angio Chest PE W and/or Wo Contrast  Result Date: 09/13/2019 CLINICAL DATA:  53 year old male with chest pain. Concern for pulmonary embolism. EXAM: CT ANGIOGRAPHY CHEST WITH CONTRAST TECHNIQUE: Multidetector CT imaging of the chest was performed using the standard protocol during bolus administration of intravenous contrast. Multiplanar CT image reconstructions and MIPs were obtained to evaluate the vascular anatomy. CONTRAST:  81m OMNIPAQUE IOHEXOL 350 MG/ML SOLN COMPARISON:  Chest radiograph dated 09/13/2019. FINDINGS: Cardiovascular: Top-normal cardiac size. No pericardial effusion. The thoracic aorta is unremarkable. No pulmonary artery embolism identified. Mediastinum/Nodes: No hilar or mediastinal adenopathy. The esophagus and the thyroid gland are grossly unremarkable. No mediastinal fluid collection or hematoma. Lungs/Pleura: Bilateral patchy airspace opacities most consistent with  multifocal pneumonia, likely atypical or viral etiology. Clinical correlation and follow-up to resolution recommended. There is no pleural effusion or pneumothorax. The central airways are patent. Upper Abdomen: No acute abnormality. Musculoskeletal: No chest wall abnormality. No acute or significant osseous findings. Review of the MIP images confirms the above findings. IMPRESSION: 1. No CT evidence of pulmonary embolism. 2. Multifocal pneumonia. Clinical correlation and follow-up to resolution recommended. Electronically Signed   By: AAnner CreteM.D.   On: 09/13/2019 22:51   DG Chest Port 1 View  Result Date: 09/16/2019 CLINICAL DATA:  Shortness of breath and COVID-19 pneumonia. EXAM: PORTABLE CHEST 1 VIEW COMPARISON:  09/13/2019 FINDINGS: The heart size and mediastinal contours are within normal limits. Lung volumes are low bilaterally. Bilateral pulmonary infiltrates predominantly in a lower lung zone distribution appear fairly stable without significant progression since the prior chest x-ray. Upper lung zones remain relatively well aerated. No overt pulmonary edema, pneumothorax or pleural fluid identified. The visualized skeletal structures are unremarkable. IMPRESSION: No significant change in bilateral pulmonary infiltrates since the prior chest x-ray. Electronically Signed   By: GAletta EdouardM.D.   On: 09/16/2019 09:29   DG Chest Portable 1 View  Result Date: 09/13/2019 CLINICAL DATA:  Shortness of breath, diabetes mellitus, hypertension EXAM: PORTABLE CHEST 1 VIEW COMPARISON:  Portable exam 1818 hours without priors for comparison FINDINGS: Upper normal heart size. Normal mediastinal contours and pulmonary vascularity. Patchy infiltrates at the inferior lungs bilaterally greater on LEFT question multifocal pneumonia, less likely atelectasis. Upper lungs clear. No pleural effusion or pneumothorax. IMPRESSION: Bibasilar opacities RIGHT greater than LEFT favor multifocal pneumonia over  atelectasis. Electronically Signed   By: MLavonia DanaM.D.   On: 09/13/2019 19:09   ECHOCARDIOGRAM COMPLETE  Result Date: 09/16/2019   ECHOCARDIOGRAM REPORT   Patient Name:   MKOLYN ROZARIODate of Exam: 09/16/2019 Medical Rec #:  [id number]       Height:       73.0 in Accession #:    [id number]      Weight:       207.7 lb Date of Birth:  [birth year]       BSA:          2.19 m Patient Age:    89years         BP:           135/88 mmHg Patient Gender: M                HR:           102 bpm. Exam Location:  Inpatient Procedure: 2D Echo Indications:    Chest Pain 786.50 / R07.9  History:        Patient has no prior history of Echocardiogram examinations.  Risk Factors:Hypertension and Diabetes. Covid 19. Acute Hypoxic                 Resp. Failure.  Sonographer:    Darlina Sicilian RDCS Referring Phys: [address] [facility]  Sonographer Comments: [place]  1. Left ventricular ejection fraction, by visual estimation, is 60 to 65%. The left ventricle has normal function. There is moderately increased left ventricular hypertrophy.  2. Left ventricular diastolic parameters are consistent with Grade I diastolic dysfunction (impaired relaxation).  3. The left ventricle has no regional wall motion abnormalities.  4. Global right ventricle was not well visualized.The right ventricular size is not well visualized. Right vetricular wall thickness was not assessed.  5. Left atrial size was normal.  6. Right atrial size was not well visualized.  7. The mitral valve is grossly normal. No evidence of mitral valve regurgitation.  8. The tricuspid valve is not well visualized.  9. The aortic valve is tricuspid. Aortic valve regurgitation is not visualized. No evidence of aortic valve sclerosis or stenosis. 10. The pulmonic valve was grossly normal. Pulmonic valve regurgitation is not visualized. 11. Aortic dilatation noted. 12. There is mild dilatation of the ascending aorta. 13. The interatrial  septum was not well visualized. FINDINGS  Left Ventricle: Left ventricular ejection fraction, by visual estimation, is 60 to 65%. The left ventricle has normal function. The left ventricle has no regional wall motion abnormalities. There is moderately increased left ventricular hypertrophy. Concentric left ventricular hypertrophy. Left ventricular diastolic parameters are consistent with Grade I diastolic dysfunction (impaired relaxation). Normal left atrial pressure. Right Ventricle: The right ventricular size is not well visualized. Right vetricular wall thickness was not assessed. Global RV systolic function is was not well visualized. Left Atrium: Left atrial size was normal in size. Right Atrium: Right atrial size was not well visualized Pericardium: There is no evidence of pericardial effusion. Mitral Valve: The mitral valve is grossly normal. No evidence of mitral valve regurgitation. Tricuspid Valve: The tricuspid valve is not well visualized. Tricuspid valve regurgitation is not demonstrated. Aortic Valve: The aortic valve is tricuspid. Aortic valve regurgitation is not visualized. The aortic valve is structurally normal, with no evidence of sclerosis or stenosis. Pulmonic Valve: The pulmonic valve was grossly normal. Pulmonic valve regurgitation is not visualized. Pulmonic regurgitation is not visualized. Aorta: The aortic root is normal in size and structure and aortic dilatation noted. There is mild dilatation of the ascending aorta. Venous: The inferior vena cava was not well visualized. IAS/Shunts: The interatrial septum was not well visualized.  LEFT VENTRICLE PLAX 2D LVIDd:         3.58 cm  Diastology LVIDs:         2.28 cm  LV e' lateral:   5.11 cm/s LV PW:         1.44 cm  LV E/e' lateral: 9.8 LV IVS:        1.11 cm  LV e' medial:    5.77 cm/s LVOT diam:     2.10 cm  LV E/e' medial:  8.7 LV SV:         36 ml LV SV Index:   16.23 LVOT Area:     3.46 cm  LEFT ATRIUM           Index LA diam:       3.10 cm 1.42 cm/m LA Vol (A4C): 33.3 ml 15.23 ml/m   AORTA Ao Root diam: 3.40 cm Ao Asc diam:  3.70 cm  MITRAL VALVE MV Area (PHT): 3.60 cm             SHUNTS MV PHT:        61.19 msec           Systemic Diam: 2.10 cm MV Decel Time: 211 msec MV E velocity: 50.00 cm/s 103 cm/s MV A velocity: 70.30 cm/s 70.3 cm/s MV E/A ratio:  0.71       1.5  Kate Sable MD Electronically signed by Kate Sable MD Signature Date/Time: 09/16/2019/9:38:42 AM    Final     Micro Results     Recent Results (from the past 240 hour(s))  Novel Coronavirus, NAA ([facility])     Status: Abnormal   Collection Time: 09/13/19 12:00 AM   Specimen: Nasopharyngeal(NP) swabs in vial transport medium   NASOPHARYNGE  TESTING  Result Value Ref Range Status   SARS-CoV-2, NAA Detected (A) Not Detected Final    Comment: This nucleic acid amplification test was developed and its performance characteristics determined by [employer]. Nucleic acid amplification tests include PCR and TMA. This test has not been FDA cleared or approved. This test has been authorized by FDA under an Emergency Use Authorization (EUA). This test is only authorized for the duration of time the declaration that circumstances exist justifying the authorization of the emergency use of in vitro diagnostic tests for detection of SARS-CoV-2 virus and/or diagnosis of COVID-19 infection under section 564(b)(1) of the Act, 21 U.S.C. 017PZW-2(H) (1), unless the authorization is terminated or revoked sooner. When diagnostic testing is negative, the possibility of a false negative result should be considered in the context of a patient's recent exposures and the presence of clinical signs and symptoms consistent with COVID-19. An individual without symptoms of COVID-19 and who is not shedding SARS-CoV-2 virus would  expect to have a negative (not detected) result in this assay.   SARS CORONAVIRUS 2 (TAT 6-24 HRS) Nasopharyngeal Nasopharyngeal  Swab     Status: Abnormal   Collection Time: 09/13/19  7:04 PM   Specimen: Nasopharyngeal Swab  Result Value Ref Range Status   SARS Coronavirus 2 POSITIVE (A) NEGATIVE Final    Comment: RESULT CALLED TO, READ BACK BY AND VERIFIED WITH: Neldon Labella 0131 09/14/2019 D BRADLEY (NOTE) SARS-CoV-2 target nucleic acids are DETECTED. The SARS-CoV-2 RNA is generally detectable in upper and lower respiratory specimens during the acute phase of infection. Positive results are indicative of the presence of SARS-CoV-2 RNA. Clinical correlation with patient history and other diagnostic information is  necessary to determine patient infection status. Positive results do not rule out bacterial infection or co-infection with other viruses.  The expected result is Negative. Fact Sheet for Patients: SugarRoll.be Fact Sheet for Healthcare Providers: https://www.woods-mathews.com/ This test is not yet approved or cleared by the [place] FDA and  has been authorized for detection and/or diagnosis of SARS-CoV-2 by FDA under an Emergency Use Authorization (EUA). This EUA will remain  in effect (meaning this test can be used) for t he duration of the COVID-19 declaration under Section 564(b)(1) of the Act, 21 U.S.C. section 360bbb-3(b)(1), unless the authorization is terminated or revoked sooner. Performed at [facility], [place] [address], [place],  [address]   Respiratory Panel by PCR     Status: None   Collection Time: 09/14/19  4:08 AM   Specimen: Nasopharyngeal Swab; Respiratory  Result Value Ref Range Status   Adenovirus NOT DETECTED NOT DETECTED Final   Coronavirus 229E NOT DETECTED NOT  DETECTED Final    Comment: (NOTE) The Coronavirus on the Respiratory Panel, DOES NOT test for the novel  Coronavirus (2019 nCoV)    Coronavirus HKU1 NOT DETECTED NOT DETECTED Final   Coronavirus NL63 NOT DETECTED NOT DETECTED Final   Coronavirus OC43 NOT  DETECTED NOT DETECTED Final   Metapneumovirus NOT DETECTED NOT DETECTED Final   Rhinovirus / Enterovirus NOT DETECTED NOT DETECTED Final   Influenza A NOT DETECTED NOT DETECTED Final   Influenza B NOT DETECTED NOT DETECTED Final   Parainfluenza Virus 1 NOT DETECTED NOT DETECTED Final   Parainfluenza Virus 2 NOT DETECTED NOT DETECTED Final   Parainfluenza Virus 3 NOT DETECTED NOT DETECTED Final   Parainfluenza Virus 4 NOT DETECTED NOT DETECTED Final   Respiratory Syncytial Virus NOT DETECTED NOT DETECTED Final   Bordetella pertussis NOT DETECTED NOT DETECTED Final   Chlamydophila pneumoniae NOT DETECTED NOT DETECTED Final   Mycoplasma pneumoniae NOT DETECTED NOT DETECTED Final    Comment: Performed at [facility], [place] [address] [place], [place] [address]    Today   Subjective    [place] today has no headache,no chest abdominal pain,no new weakness tingling or numbness, feels much better wants to go home today.     Objective   Blood pressure (!) 150/94, pulse 85, temperature 98.7 F (37.1 C), temperature source Oral, resp. rate (!) 24, height '6\' 1"'  (1.854 m), weight 94.2 kg, SpO2 (!) 89 %.   Intake/Output Summary (Last 24 hours) at 09/20/2019 1009 Last data filed at 09/20/2019 0934 Gross per 24 hour  Intake 460 ml  Output 1160 ml  Net -700 ml    Exam  Awake Alert,  No new F.N deficits, Normal affect [place].AT,PERRAL Supple Neck,No JVD, No cervical lymphadenopathy appriciated.  Symmetrical Chest wall movement, Good air movement bilaterally, CTAB RRR,No Gallops,Rubs or new Murmurs, No Parasternal Heave +ve B.Sounds, Abd Soft, Non tender, No organomegaly appriciated, No rebound -guarding or rigidity. No Cyanosis, Clubbing or edema, No new Rash or bruise   Data Review   CBC w Diff:  Lab Results  Component Value Date   WBC 19.6 (H) 09/20/2019   HGB 14.1 09/20/2019   HCT 43.1 09/20/2019   PLT 408 (H) 09/20/2019   LYMPHOPCT 11 09/20/2019   MONOPCT 7  09/20/2019   EOSPCT 1 09/20/2019   BASOPCT 1 09/20/2019    CMP:  Lab Results  Component Value Date   NA 142 09/20/2019   K 3.7 09/20/2019   CL 104 09/20/2019   CO2 27 09/20/2019   BUN 31 (H) 09/20/2019   CREATININE 1.35 (H) 09/20/2019   PROT 5.8 (L) 09/20/2019   ALBUMIN 2.5 (L) 09/20/2019   BILITOT 0.5 09/20/2019   ALKPHOS 61 09/20/2019   AST 44 (H) 09/20/2019   ALT 103 (H) 09/20/2019  .   Total Time in preparing paper work, data evaluation and todays exam - 33 minutes  Lala Lund M.D on 09/20/2019 at 10:09 AM  [facility]   Office  [phone]

## 2019-09-20 NOTE — Progress Notes (Signed)
Pt sitting up in chair. O2 taken off while at rest and pt is sating between 86-90% on RA. No signs of shortness of breath, does not stay at 86% long, just needs deep breaths and O2 sats increase. While ambulating in room O2 sats dropped to about 81-82%, pt asyptomatic. 2L O2 replaced, pt sitting back in chair

## 2019-09-26 ENCOUNTER — Encounter: Payer: Self-pay | Admitting: Internal Medicine

## 2019-09-26 ENCOUNTER — Ambulatory Visit: Payer: BC Managed Care – PPO | Attending: Internal Medicine | Admitting: Internal Medicine

## 2019-09-26 ENCOUNTER — Other Ambulatory Visit: Payer: Self-pay

## 2019-09-26 DIAGNOSIS — E1165 Type 2 diabetes mellitus with hyperglycemia: Secondary | ICD-10-CM

## 2019-09-26 DIAGNOSIS — E118 Type 2 diabetes mellitus with unspecified complications: Secondary | ICD-10-CM

## 2019-09-26 DIAGNOSIS — I10 Essential (primary) hypertension: Secondary | ICD-10-CM

## 2019-09-26 DIAGNOSIS — U071 COVID-19: Secondary | ICD-10-CM

## 2019-09-26 DIAGNOSIS — J1282 Pneumonia due to coronavirus disease 2019: Secondary | ICD-10-CM | POA: Diagnosis not present

## 2019-09-26 DIAGNOSIS — Z9114 Patient's other noncompliance with medication regimen: Secondary | ICD-10-CM

## 2019-09-26 DIAGNOSIS — R748 Abnormal levels of other serum enzymes: Secondary | ICD-10-CM

## 2019-09-26 NOTE — Progress Notes (Signed)
Patient has been called and DOB has been verified. Patient has been screened and transferred to PCP to start phone visit.     

## 2019-09-26 NOTE — Progress Notes (Signed)
Virtual Visit via Telephone Note  I connected with Tywann Jouett on 09/26/19 at  2:30 PM EST by telephone and verified that I am speaking with the correct person using two identifiers.  Location: Patient: home Provider: home   I discussed the limitations, risks, security and privacy concerns of performing an evaluation and management service by telephone and the availability of in person appointments. I also discussed with the patient that there may be a patient responsible charge related to this service. The patient expressed understanding and agreed to proceed.   History of Present Illness: F/u hosp for covid. Pt doing better since d/c, reports sob only when bending over to pray, otherwise no sob, no f/c, little cough, no fatigue.  DM- f.s. fasting 249, before lunch 359, has not been using lantus, taking novolog as prescribed. Denies polydipsia, polyuria.    Patient Active Problem List   Diagnosis Date Noted  . Acute respiratory failure with hypoxia ([place]) 09/14/2019  . Pneumonia due to COVID-19 virus 09/14/2019  . Essential hypertension 09/14/2019  . Type 2 diabetes mellitus without complication ([place]) 0000000  . COVID-19 09/14/2019    Outpatient Encounter Medications as of 09/26/2019  Medication Sig  . albuterol (VENTOLIN HFA) 108 (90 Base) MCG/ACT inhaler Inhale 2 puffs into the lungs every 6 (six) hours as needed for wheezing or shortness of breath.  Marland Kitchen amLODipine (NORVASC) 10 MG tablet Take 1 tablet (10 mg total) by mouth daily.  . brimonidine (ALPHAGAN) 0.2 % ophthalmic solution Place 1 drop into both eyes 3 (three) times daily.  . carvedilol (COREG) 6.25 MG tablet Take 1 tablet (6.25 mg total) by mouth 2 (two) times daily with a meal.  . glipiZIDE (GLUCOTROL XL) 10 MG 24 hr tablet Take 10 mg by mouth daily.  . insulin aspart (NOVOLOG) 100 UNIT/ML injection Before each meal 3 times a day , 140-199 - 2 units, 200-250 - 4 units, 251-299 - 6 units,  300-349 - 8 units,  350 or  above 10 units. Insulin PEN if approved, provide syringes and needles if needed.  . insulin glargine (LANTUS) 100 UNIT/ML injection Inject 0.25 mLs (25 Units total) into the skin at bedtime. Dispense insulin pen if approved, if not dispense as needed syringes and needles for 1 month supply. Can switch to Levemir. Diagnosis E 11.65.  Marland Kitchen lisinopril-hydrochlorothiazide (PRINZIDE,ZESTORETIC) 20-12.5 MG per tablet Take 1 tablet by mouth daily.  . metFORMIN (GLUMETZA) 500 MG (MOD) 24 hr tablet Take 1,000 mg by mouth 2 (two) times daily with a meal.  . simvastatin (ZOCOR) 20 MG tablet Take 20 mg by mouth every evening.  . timolol (TIMOPTIC) 0.5 % ophthalmic solution Place 1 drop into both eyes 2 (two) times daily.  . methylPREDNISolone (MEDROL DOSEPAK) 4 MG TBPK tablet follow package directions (Patient not taking: Reported on 09/26/2019)   No facility-administered encounter medications on file as of 09/26/2019.      Assessment and Plan: 1. Pneumonia due to COVID-19 virus Clinically stable, advised to continue to monitor sx, advised to go to ER if sx worsen - CBC  2. Type 2 diabetes mellitus with hyperglycemia, unspecified whether long term insulin use ([facility]) Uncontrolled due to non-compliance with insulin, advised to start lantus as instructed, increase fluids follow diet, f/u in 3 days  3. HTN (hypertension), benign Continue same meds, will need visit for bp check  4. Elevated liver enzymes Due to covid, monitor - Comprehensive metabolic panel   Follow Up Instructions:  I discussed the assessment and treatment  plan with the patient. The patient was provided an opportunity to ask questions and all were answered. The patient agreed with the plan and demonstrated an understanding of the instructions.   The patient was advised to call back or seek an in-person evaluation if the symptoms worsen or if the condition fails to improve as anticipated.  I provided 30 minutes of non-face-to-face time  during this encounter.   Kimber Relic, MD

## 2019-10-13 ENCOUNTER — Ambulatory Visit (INDEPENDENT_AMBULATORY_CARE_PROVIDER_SITE_OTHER): Payer: BC Managed Care – PPO | Admitting: Primary Care

## 2019-10-21 DIAGNOSIS — U071 COVID-19: Secondary | ICD-10-CM | POA: Diagnosis not present

## 2019-11-20 DIAGNOSIS — U071 COVID-19: Secondary | ICD-10-CM | POA: Diagnosis not present

## 2020-02-17 DIAGNOSIS — Z961 Presence of intraocular lens: Secondary | ICD-10-CM | POA: Diagnosis not present

## 2020-02-17 DIAGNOSIS — H401232 Low-tension glaucoma, bilateral, moderate stage: Secondary | ICD-10-CM | POA: Diagnosis not present

## 2020-02-17 DIAGNOSIS — E113593 Type 2 diabetes mellitus with proliferative diabetic retinopathy without macular edema, bilateral: Secondary | ICD-10-CM | POA: Diagnosis not present

## 2020-02-17 DIAGNOSIS — H26493 Other secondary cataract, bilateral: Secondary | ICD-10-CM | POA: Diagnosis not present

## 2020-03-07 ENCOUNTER — Encounter (INDEPENDENT_AMBULATORY_CARE_PROVIDER_SITE_OTHER): Payer: BC Managed Care – PPO | Admitting: Ophthalmology

## 2020-07-06 IMAGING — CT CT ANGIO CHEST
2 of 6 series · 18 of 46 positions shown · IV contrast (omnipaque)
Comparison: Chest radiograph dated 09/13/2019.

CLINICAL DATA: 53-year-old male with chest pain. Concern for
pulmonary embolism.

EXAM:
CT ANGIOGRAPHY CHEST WITH CONTRAST
TECHNIQUE: Multidetector CT imaging of the chest was performed using the
standard protocol during bolus administration of intravenous
contrast. Multiplanar CT image reconstructions and MIPs were
obtained to evaluate the vascular anatomy.
CONTRAST:  75mL OMNIPAQUE IOHEXOL 350 MG/ML SOLN

[Series 7: thins · axial · 0.88mm/px · z∈[-247,-4]mm · 15 of 267 slices shown]
[im 12/267  lung]
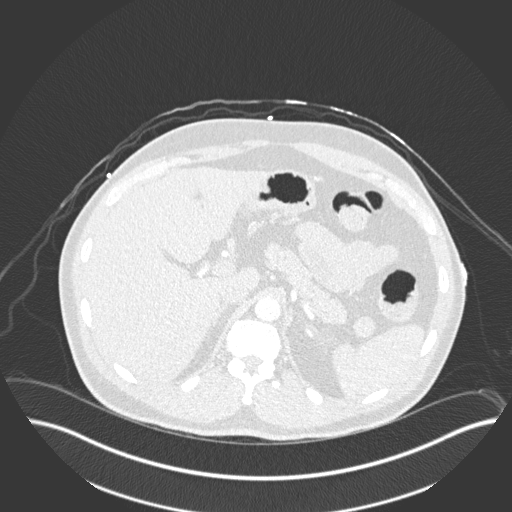
[im 35/267  soft-tissue]
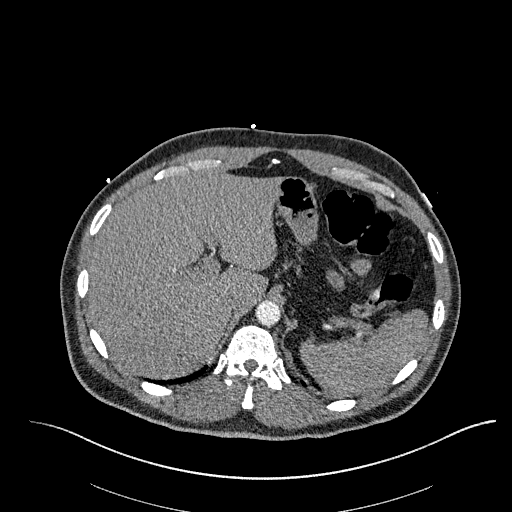
[im 47/267  lung]
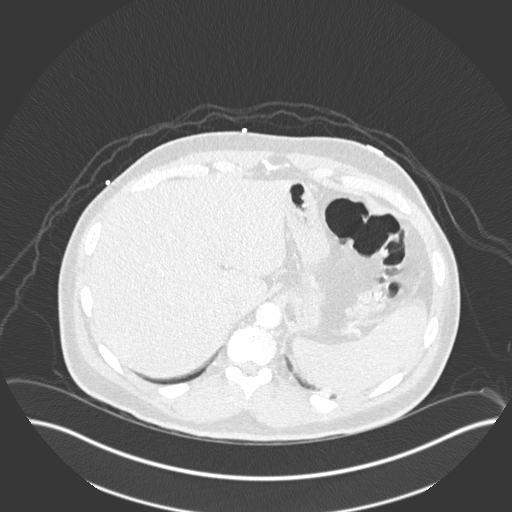
[im 70/267  soft-tissue]
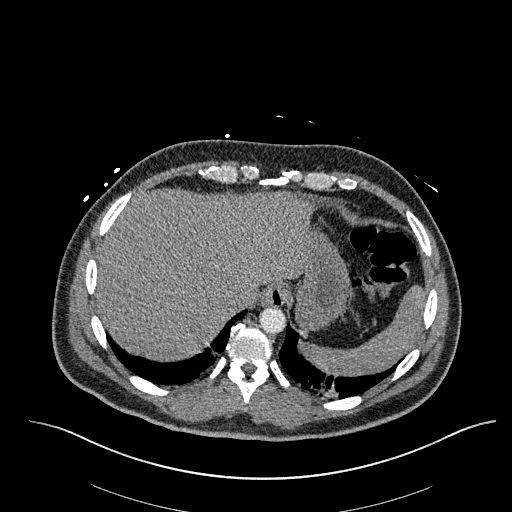
[im 81/267  lung]
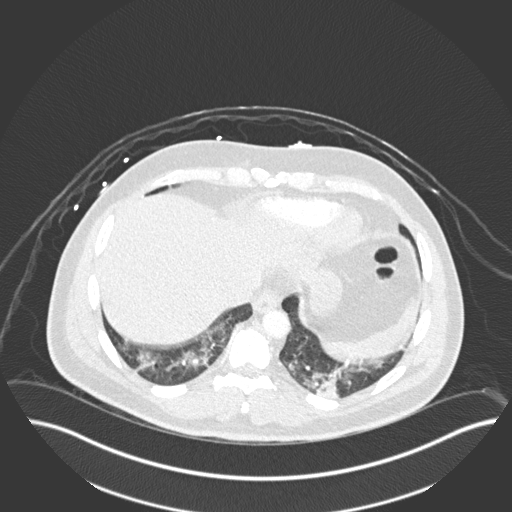
[im 105/267  soft-tissue]
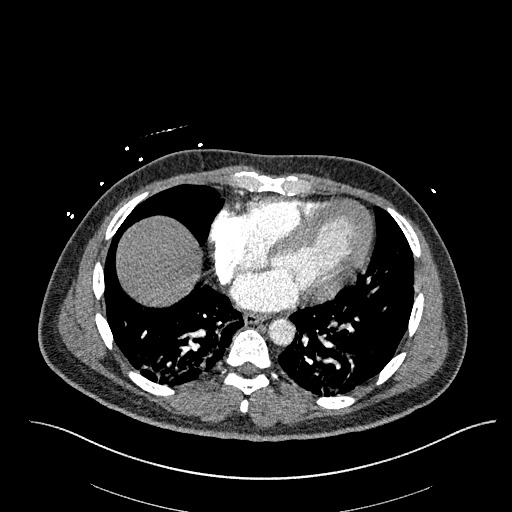
[im 116/267  lung]
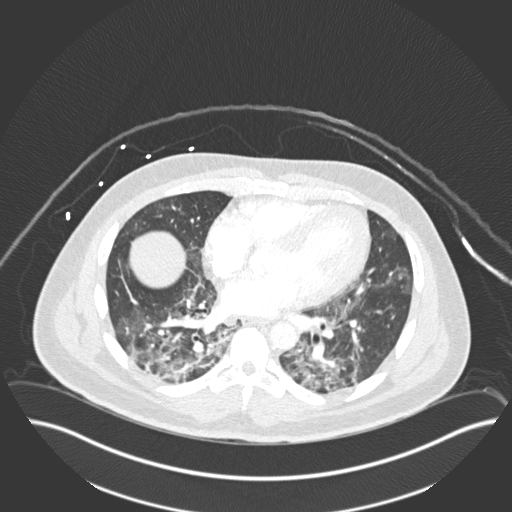
[im 139/267  soft-tissue]
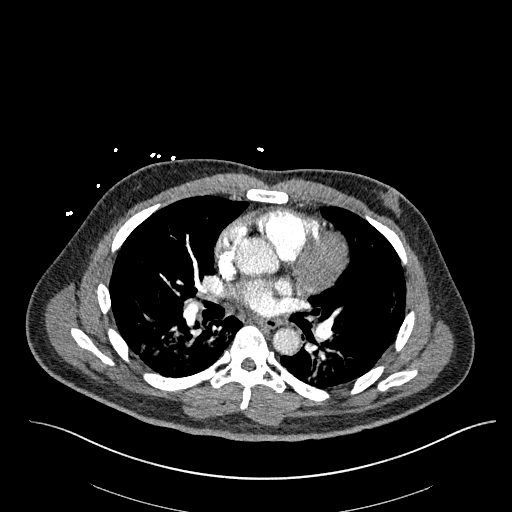
[im 151/267  lung]
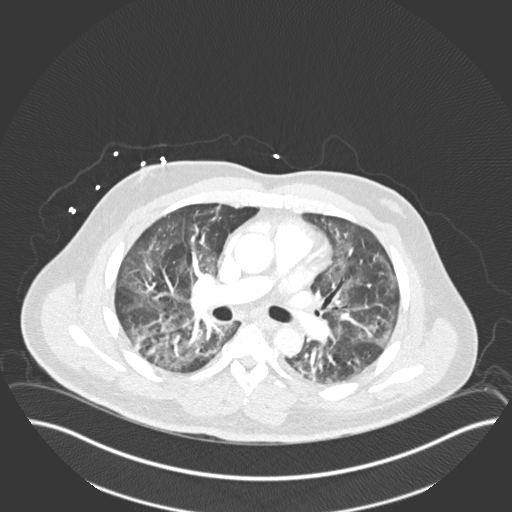
[im 162/267  soft-tissue]
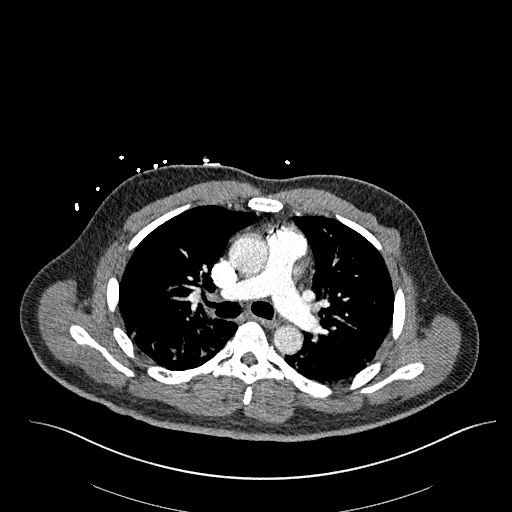
[im 186/267  lung]
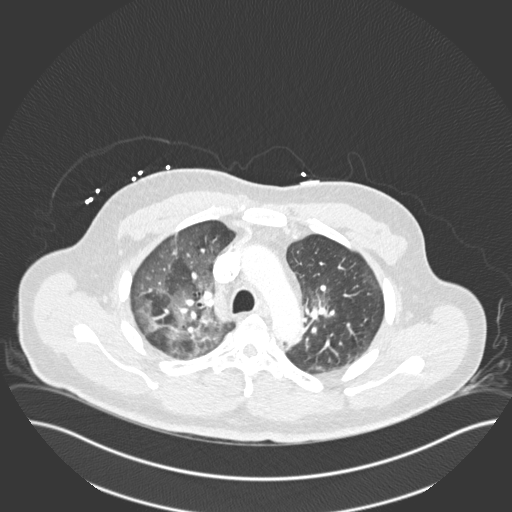
[im 197/267  soft-tissue]
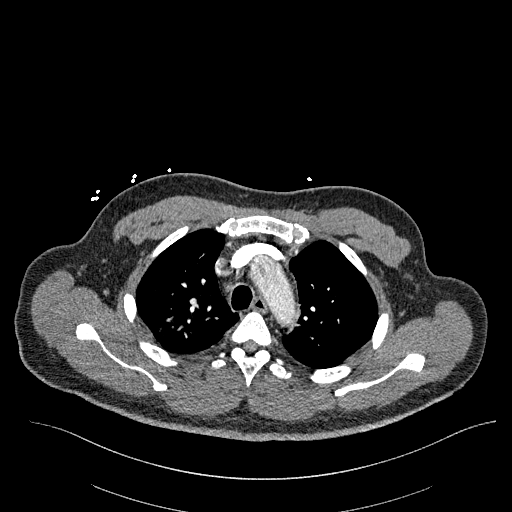
[im 220/267  lung]
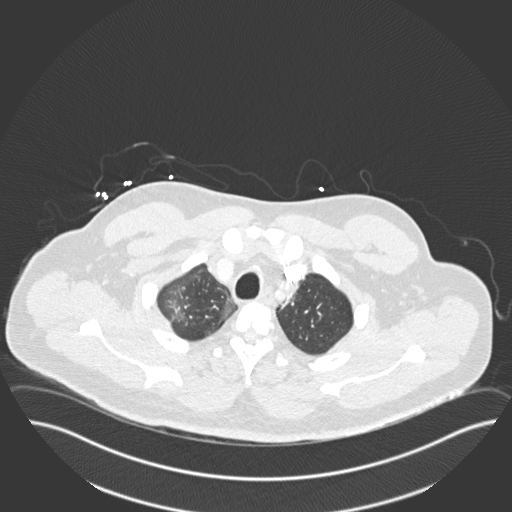
[im 232/267  soft-tissue]
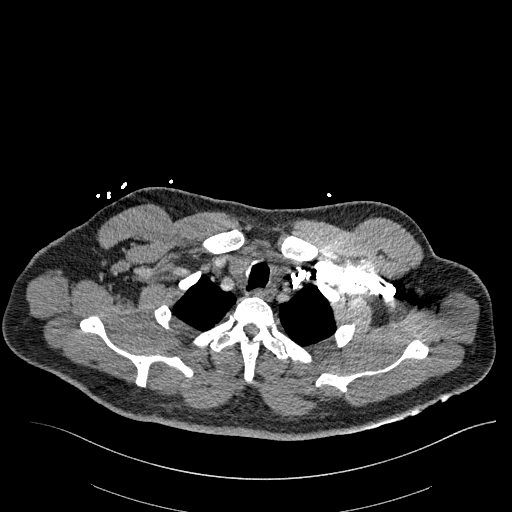
[im 255/267  lung]
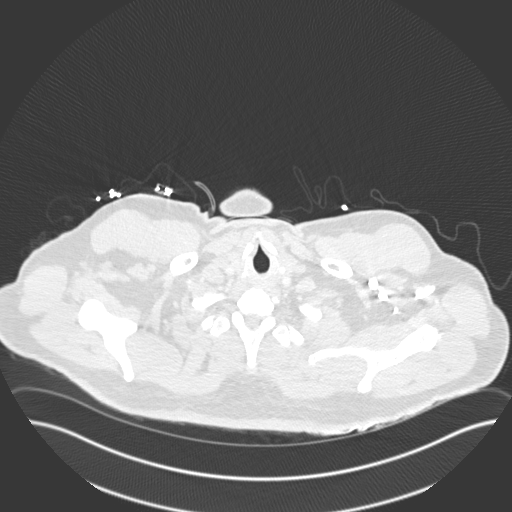

[Series 9: coronal mpr · coronal · 0.54mm/px · 3 of 124 slices shown]
[im 31/124  soft-tissue]
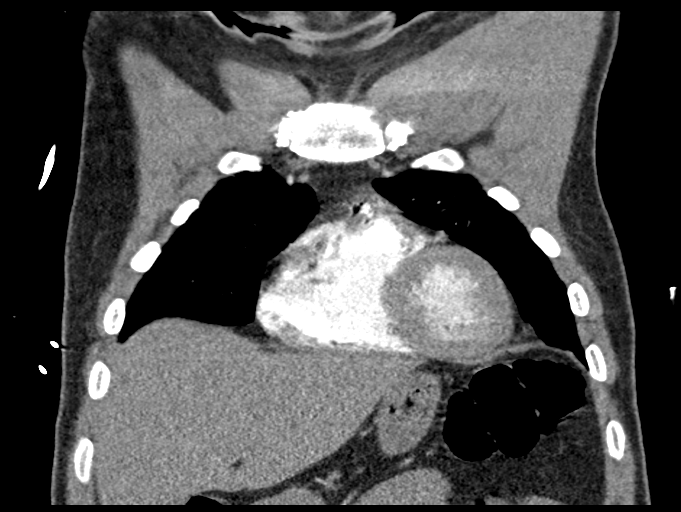
[im 62/124  soft-tissue]
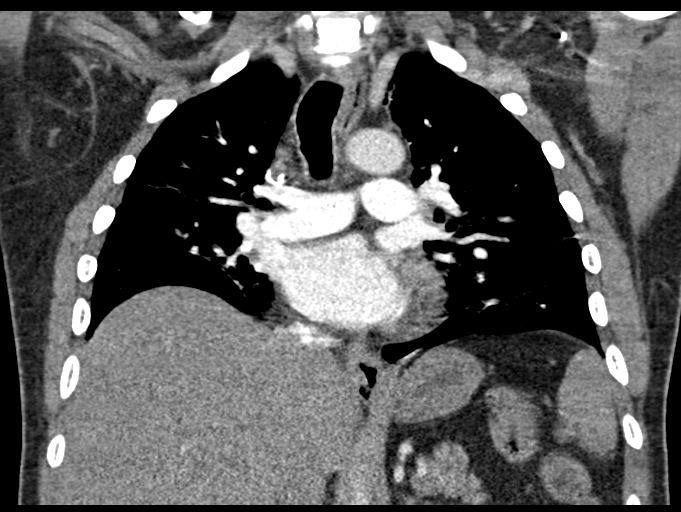
[im 93/124  soft-tissue]
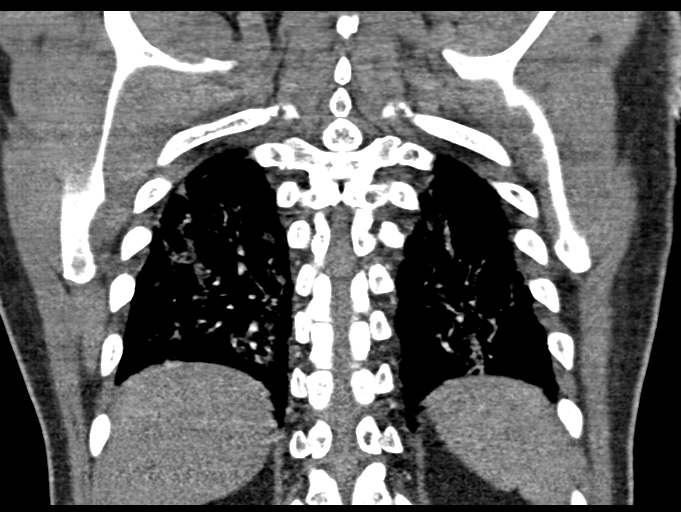

[18 of 46 positions shown; findings below may reference images not displayed]

FINDINGS: Cardiovascular: Top-normal cardiac size. No pericardial effusion.
The thoracic aorta is unremarkable. No pulmonary artery embolism
identified.

Mediastinum/Nodes: No hilar or mediastinal adenopathy. The esophagus
and the thyroid gland are grossly unremarkable. No mediastinal fluid
collection or hematoma.

Lungs/Pleura: Bilateral patchy airspace opacities most consistent
with multifocal pneumonia, likely atypical or viral etiology.
Clinical correlation and follow-up to resolution recommended. There
is no pleural effusion or pneumothorax. The central airways are
patent.

Upper Abdomen: No acute abnormality.

Musculoskeletal: No chest wall abnormality. No acute or significant
osseous findings.

Review of the MIP images confirms the above findings.
IMPRESSION: 1. No CT evidence of pulmonary embolism.
2. Multifocal pneumonia. Clinical correlation and follow-up to
resolution recommended.

## 2020-07-09 IMAGING — DX DG CHEST 1V PORT
1 series · 1 of 1 positions shown · non-contrast
Comparison: 09/13/2019

CLINICAL DATA: Shortness of breath and [id number] pneumonia.

EXAM:
PORTABLE CHEST 1 VIEW

[chest]
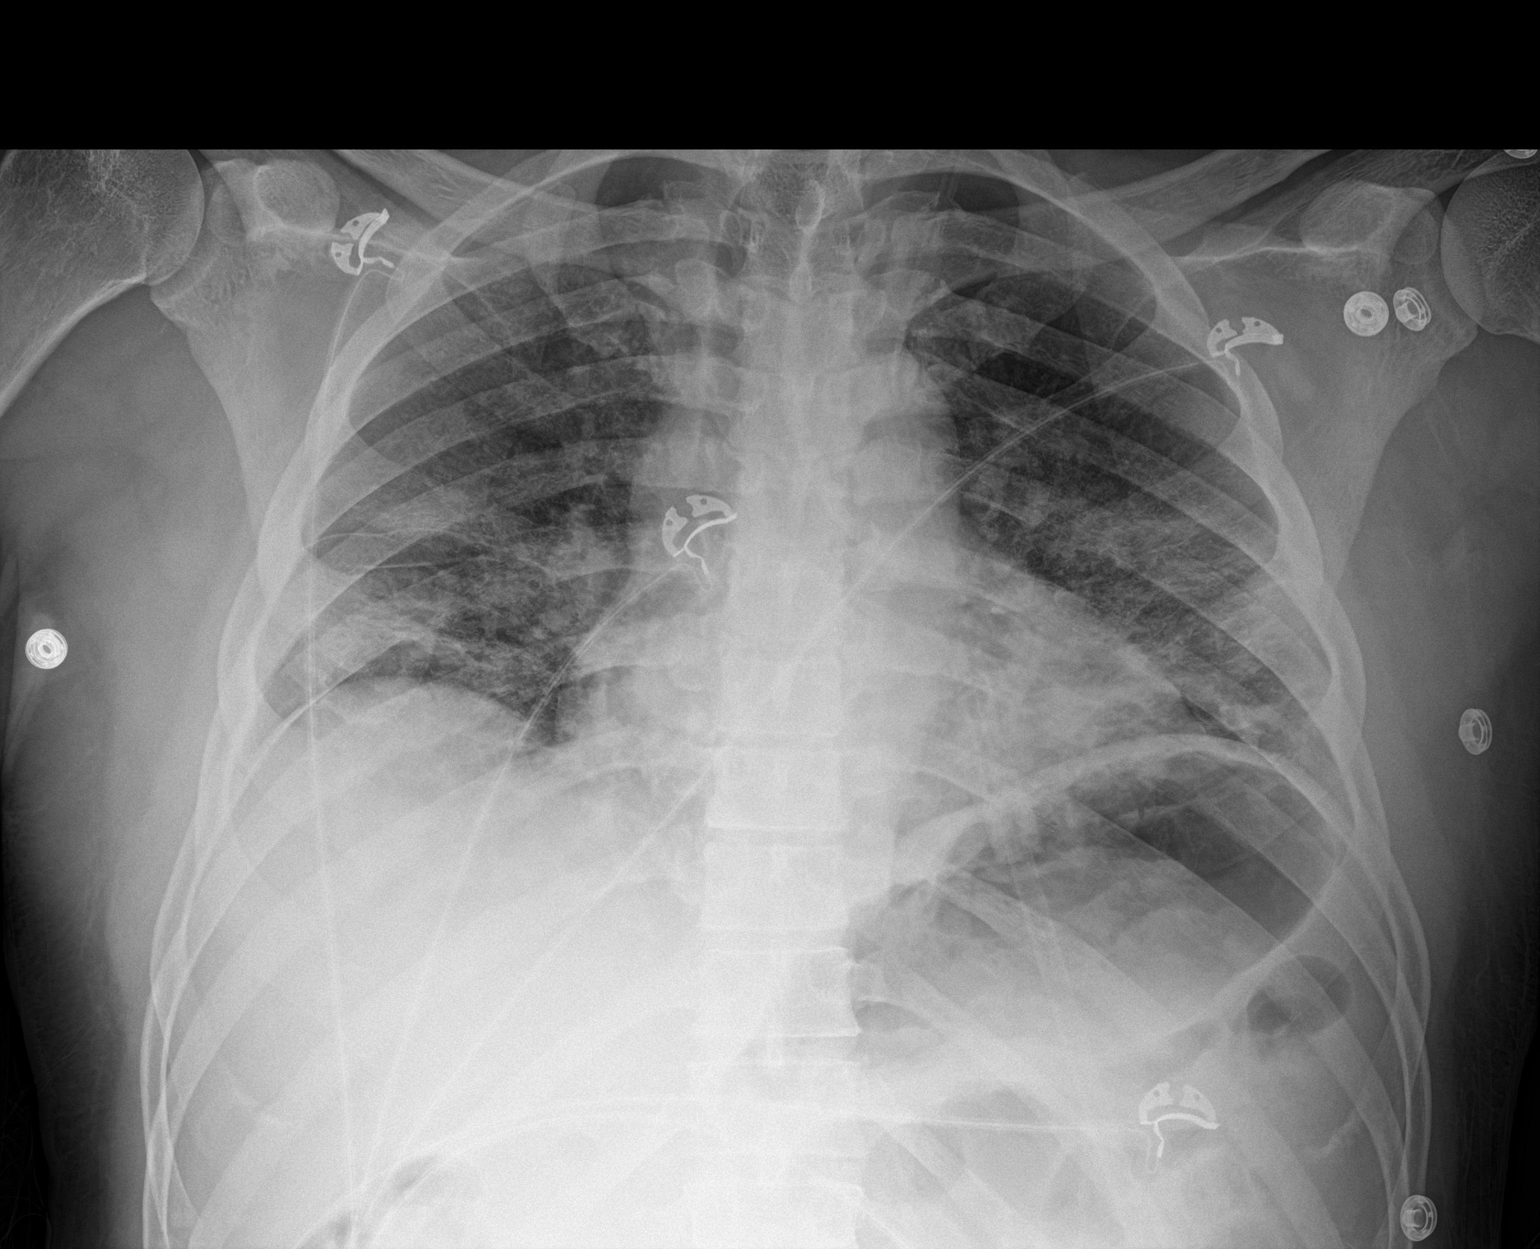

[1 of 1 positions shown; findings below may reference images not displayed]

FINDINGS: The heart size and mediastinal contours are within normal limits.
Lung volumes are low bilaterally. Bilateral pulmonary infiltrates
predominantly in a lower lung zone distribution appear fairly stable
without significant progression since the prior chest x-ray. Upper
lung zones remain relatively well aerated. No overt pulmonary edema,
pneumothorax or pleural fluid identified. The visualized skeletal
structures are unremarkable.
IMPRESSION: No significant change in bilateral pulmonary infiltrates since the
prior chest x-ray.

## 2020-11-05 DIAGNOSIS — H903 Sensorineural hearing loss, bilateral: Secondary | ICD-10-CM | POA: Diagnosis not present

## 2020-12-28 DIAGNOSIS — H401232 Low-tension glaucoma, bilateral, moderate stage: Secondary | ICD-10-CM | POA: Diagnosis not present

## 2020-12-28 DIAGNOSIS — H26493 Other secondary cataract, bilateral: Secondary | ICD-10-CM | POA: Diagnosis not present

## 2020-12-28 DIAGNOSIS — I10 Essential (primary) hypertension: Secondary | ICD-10-CM | POA: Diagnosis not present

## 2020-12-28 DIAGNOSIS — E113593 Type 2 diabetes mellitus with proliferative diabetic retinopathy without macular edema, bilateral: Secondary | ICD-10-CM | POA: Diagnosis not present

## 2022-05-22 DIAGNOSIS — Z31440 Encounter of male for testing for genetic disease carrier status for procreative management: Secondary | ICD-10-CM | POA: Diagnosis not present

## 2022-07-04 DIAGNOSIS — Z3009 Encounter for other general counseling and advice on contraception: Secondary | ICD-10-CM | POA: Diagnosis not present

## 2022-09-19 DIAGNOSIS — Z302 Encounter for sterilization: Secondary | ICD-10-CM | POA: Diagnosis not present

## 2023-09-17 ENCOUNTER — Other Ambulatory Visit: Payer: Self-pay | Admitting: Nephrology

## 2023-09-17 DIAGNOSIS — E559 Vitamin D deficiency, unspecified: Secondary | ICD-10-CM

## 2023-09-17 DIAGNOSIS — N184 Chronic kidney disease, stage 4 (severe): Secondary | ICD-10-CM

## 2023-09-22 ENCOUNTER — Other Ambulatory Visit: Payer: BC Managed Care – PPO

## 2023-10-22 ENCOUNTER — Other Ambulatory Visit: Payer: BC Managed Care – PPO

## 2023-10-30 ENCOUNTER — Ambulatory Visit
Admission: RE | Admit: 2023-10-30 | Discharge: 2023-10-30 | Disposition: A | Discharge status: Home / Self Care | Payer: BC Managed Care – PPO | Source: Ambulatory Visit | Attending: Nephrology | Admitting: Nephrology

## 2023-10-30 DIAGNOSIS — N184 Chronic kidney disease, stage 4 (severe): Secondary | ICD-10-CM

## 2023-10-30 DIAGNOSIS — E559 Vitamin D deficiency, unspecified: Secondary | ICD-10-CM

## 2024-05-13 ENCOUNTER — Ambulatory Visit: Admitting: Podiatry

## 2024-06-27 ENCOUNTER — Encounter (INDEPENDENT_AMBULATORY_CARE_PROVIDER_SITE_OTHER): Admitting: Ophthalmology

## 2024-07-01 ENCOUNTER — Encounter (INDEPENDENT_AMBULATORY_CARE_PROVIDER_SITE_OTHER): Admitting: Ophthalmology

## 2024-07-01 DIAGNOSIS — Z7985 Long-term (current) use of injectable non-insulin antidiabetic drugs: Secondary | ICD-10-CM

## 2024-07-01 DIAGNOSIS — E113512 Type 2 diabetes mellitus with proliferative diabetic retinopathy with macular edema, left eye: Secondary | ICD-10-CM | POA: Diagnosis not present

## 2024-07-01 DIAGNOSIS — I10 Essential (primary) hypertension: Secondary | ICD-10-CM

## 2024-07-01 DIAGNOSIS — E113411 Type 2 diabetes mellitus with severe nonproliferative diabetic retinopathy with macular edema, right eye: Secondary | ICD-10-CM

## 2024-07-01 DIAGNOSIS — Z7984 Long term (current) use of oral hypoglycemic drugs: Secondary | ICD-10-CM

## 2024-07-01 DIAGNOSIS — H35033 Hypertensive retinopathy, bilateral: Secondary | ICD-10-CM

## 2024-07-01 DIAGNOSIS — H43813 Vitreous degeneration, bilateral: Secondary | ICD-10-CM

## 2024-07-29 ENCOUNTER — Encounter (INDEPENDENT_AMBULATORY_CARE_PROVIDER_SITE_OTHER): Admitting: Ophthalmology

## 2024-07-29 DIAGNOSIS — Z7985 Long-term (current) use of injectable non-insulin antidiabetic drugs: Secondary | ICD-10-CM | POA: Diagnosis not present

## 2024-07-29 DIAGNOSIS — E113511 Type 2 diabetes mellitus with proliferative diabetic retinopathy with macular edema, right eye: Secondary | ICD-10-CM | POA: Diagnosis not present

## 2024-07-29 DIAGNOSIS — I10 Essential (primary) hypertension: Secondary | ICD-10-CM

## 2024-07-29 DIAGNOSIS — Z7984 Long term (current) use of oral hypoglycemic drugs: Secondary | ICD-10-CM

## 2024-07-29 DIAGNOSIS — H43813 Vitreous degeneration, bilateral: Secondary | ICD-10-CM

## 2024-07-29 DIAGNOSIS — E113592 Type 2 diabetes mellitus with proliferative diabetic retinopathy without macular edema, left eye: Secondary | ICD-10-CM

## 2024-07-29 DIAGNOSIS — H35033 Hypertensive retinopathy, bilateral: Secondary | ICD-10-CM

## 2024-08-03 ENCOUNTER — Encounter (INDEPENDENT_AMBULATORY_CARE_PROVIDER_SITE_OTHER): Admitting: Ophthalmology

## 2024-08-03 DIAGNOSIS — Z7985 Long-term (current) use of injectable non-insulin antidiabetic drugs: Secondary | ICD-10-CM

## 2024-08-03 DIAGNOSIS — E113512 Type 2 diabetes mellitus with proliferative diabetic retinopathy with macular edema, left eye: Secondary | ICD-10-CM

## 2024-08-03 DIAGNOSIS — Z7984 Long term (current) use of oral hypoglycemic drugs: Secondary | ICD-10-CM

## 2024-08-26 ENCOUNTER — Encounter (INDEPENDENT_AMBULATORY_CARE_PROVIDER_SITE_OTHER): Admitting: Ophthalmology

## 2024-08-26 DIAGNOSIS — Z7985 Long-term (current) use of injectable non-insulin antidiabetic drugs: Secondary | ICD-10-CM | POA: Diagnosis not present

## 2024-08-26 DIAGNOSIS — Z7984 Long term (current) use of oral hypoglycemic drugs: Secondary | ICD-10-CM

## 2024-08-26 DIAGNOSIS — E113413 Type 2 diabetes mellitus with severe nonproliferative diabetic retinopathy with macular edema, bilateral: Secondary | ICD-10-CM | POA: Diagnosis not present

## 2024-08-26 DIAGNOSIS — H35033 Hypertensive retinopathy, bilateral: Secondary | ICD-10-CM

## 2024-08-26 DIAGNOSIS — I10 Essential (primary) hypertension: Secondary | ICD-10-CM

## 2024-08-26 DIAGNOSIS — H43813 Vitreous degeneration, bilateral: Secondary | ICD-10-CM

## 2024-09-23 ENCOUNTER — Encounter (INDEPENDENT_AMBULATORY_CARE_PROVIDER_SITE_OTHER): Admitting: Ophthalmology

## 2024-09-23 DIAGNOSIS — H43813 Vitreous degeneration, bilateral: Secondary | ICD-10-CM | POA: Diagnosis not present

## 2024-09-23 DIAGNOSIS — Z7985 Long-term (current) use of injectable non-insulin antidiabetic drugs: Secondary | ICD-10-CM | POA: Diagnosis not present

## 2024-09-23 DIAGNOSIS — Z7984 Long term (current) use of oral hypoglycemic drugs: Secondary | ICD-10-CM

## 2024-09-23 DIAGNOSIS — H35033 Hypertensive retinopathy, bilateral: Secondary | ICD-10-CM | POA: Diagnosis not present

## 2024-09-23 DIAGNOSIS — I10 Essential (primary) hypertension: Secondary | ICD-10-CM

## 2024-09-23 DIAGNOSIS — E113413 Type 2 diabetes mellitus with severe nonproliferative diabetic retinopathy with macular edema, bilateral: Secondary | ICD-10-CM | POA: Diagnosis not present

## 2024-10-21 ENCOUNTER — Encounter (INDEPENDENT_AMBULATORY_CARE_PROVIDER_SITE_OTHER): Admitting: Ophthalmology

## 2024-11-18 ENCOUNTER — Encounter (INDEPENDENT_AMBULATORY_CARE_PROVIDER_SITE_OTHER): Admitting: Ophthalmology
